# Patient Record
Sex: Male | Born: 1955
Health system: Southern US, Community
[De-identification: ages and names within clinical notes are randomized; demographics above are authoritative.]

## PROBLEM LIST (undated history)

## (undated) DIAGNOSIS — R112 Nausea with vomiting, unspecified: Secondary | ICD-10-CM

## (undated) DIAGNOSIS — R0602 Shortness of breath: Secondary | ICD-10-CM

## (undated) DIAGNOSIS — R079 Chest pain, unspecified: Secondary | ICD-10-CM

## (undated) DIAGNOSIS — I10 Essential (primary) hypertension: Secondary | ICD-10-CM

## (undated) HISTORY — DX: Essential (primary) hypertension: I10

## (undated) HISTORY — PX: NO PAST SURGERIES: SHX2092

## (undated) HISTORY — DX: Chest pain, unspecified: R07.9

## (undated) HISTORY — DX: Shortness of breath: R06.02

## (undated) HISTORY — DX: Nausea with vomiting, unspecified: R11.2

---

## 2004-06-10 ENCOUNTER — Ambulatory Visit: Payer: Self-pay

## 2006-05-29 ENCOUNTER — Emergency Department: Payer: Self-pay | Admitting: Emergency Medicine

## 2009-01-31 DIAGNOSIS — B351 Tinea unguium: Secondary | ICD-10-CM | POA: Insufficient documentation

## 2009-01-31 DIAGNOSIS — L03039 Cellulitis of unspecified toe: Secondary | ICD-10-CM | POA: Insufficient documentation

## 2009-10-17 DIAGNOSIS — R197 Diarrhea, unspecified: Secondary | ICD-10-CM | POA: Insufficient documentation

## 2011-10-25 ENCOUNTER — Emergency Department: Payer: Self-pay | Admitting: Emergency Medicine

## 2011-10-25 LAB — CBC
HCT: 44.5 % (ref 40.0–52.0)
HGB: 14.8 g/dL (ref 13.0–18.0)
MCHC: 33.2 g/dL (ref 32.0–36.0)
MCV: 87 fL (ref 80–100)
RBC: 5.13 10*6/uL (ref 4.40–5.90)
WBC: 8.6 10*3/uL (ref 3.8–10.6)

## 2011-10-25 LAB — COMPREHENSIVE METABOLIC PANEL
Albumin: 4.1 g/dL (ref 3.4–5.0)
BUN: 16 mg/dL (ref 7–18)
Chloride: 97 mmol/L — ABNORMAL LOW (ref 98–107)
Co2: 26 mmol/L (ref 21–32)
Creatinine: 1.11 mg/dL (ref 0.60–1.30)
EGFR (African American): >60
EGFR (Non-African Amer.): >60
SGOT(AST): 20 U/L (ref 15–37)
SGPT (ALT): 27 U/L
Sodium: 138 mmol/L (ref 136–145)

## 2011-10-25 LAB — CK TOTAL AND CKMB (NOT AT ARMC): CK, Total: 306 U/L — ABNORMAL HIGH (ref 35–232)

## 2011-10-27 ENCOUNTER — Encounter: Payer: Self-pay | Admitting: *Deleted

## 2011-10-28 ENCOUNTER — Ambulatory Visit (INDEPENDENT_AMBULATORY_CARE_PROVIDER_SITE_OTHER): Payer: Self-pay | Admitting: Cardiovascular Disease

## 2011-10-28 ENCOUNTER — Encounter: Payer: Self-pay | Admitting: Cardiovascular Disease

## 2011-10-28 DIAGNOSIS — R079 Chest pain, unspecified: Secondary | ICD-10-CM | POA: Insufficient documentation

## 2011-10-28 DIAGNOSIS — I10 Essential (primary) hypertension: Secondary | ICD-10-CM | POA: Insufficient documentation

## 2011-10-28 DIAGNOSIS — E119 Type 2 diabetes mellitus without complications: Secondary | ICD-10-CM | POA: Insufficient documentation

## 2011-10-28 MED ORDER — LISINOPRIL 20 MG PO TABS: 20.0000 mg | ORAL_TABLET | Freq: Every day | ORAL | Status: DC

## 2011-10-28 MED ORDER — METOPROLOL TARTRATE 25 MG PO TABS: 25.0000 mg | ORAL_TABLET | Freq: Two times a day (BID) | ORAL | Status: AC

## 2011-10-28 NOTE — Assessment & Plan Note (Signed)
We have encouraged him to [facility]up [facility].

## 2011-10-28 NOTE — Progress Notes (Signed)
Patient ID: [name], male    DOB: [birth year], 56 y.o.   MRN: [id number]  HPI Comments: [name] is a pleasant 56 year old gentleman previously seen at [facility] last visit approximately one year ago per the patient seen by Joneen Boers, with diabetes since 2006, no smoking history who presents after being evaluated in the emergency room at [facility] and referred to our office for further evaluation for chest pain.  He reports that this past Monday, 4 days ago, he had an episode of tachycardia lasting for 15 minutes while he was sleeping. Symptoms resolved but then he developed some chest tightness lasting for 10 minutes. He went to the emergency room. EKG showed nonspecific T-wave abnormality in leads 3, aVF poor R wave progression through the anterior precordial leads. Lab work was essentially normal including normal cardiac enzymes. Glucose level was 446 and he reports he had been not taking his diabetes medicines. He was prescribed his medications and was discharged to home with followup in our office. He denies any further episodes of chest discomfort since that time. Otherwise he feels well.   EKG today shows normal sinus rhythm with rate 94 beats per minute with nonspecific T-wave abnormality in lead V3 to V6, 2, 3, aVF  Outpatient Encounter Prescriptions as of 10/28/2011  Medication Sig Dispense Refill  . glipiZIDE (GLUCOTROL) 5 MG tablet Take 5 mg by mouth daily.      Marland Kitchen lisinopril (PRINIVIL,ZESTRIL) 20 MG tablet Take 1 tablet (20 mg total) by mouth daily.  30 tablet  6  . metFORMIN (GLUCOPHAGE) 1000 MG tablet Take 1,000 mg by mouth 2 (two) times daily with a meal.      . DISCONTD: lisinopril (PRINIVIL,ZESTRIL) 5 MG tablet Take 5 mg by mouth daily.      . metoprolol tartrate (LOPRESSOR) 25 MG tablet Take 1 tablet (25 mg total) by mouth 2 (two) times daily.  60 tablet  6      Review of Systems  Constitutional: Negative.   HENT: Negative.   Eyes: Negative.     Respiratory: Negative.   Cardiovascular: Positive for chest pain and palpitations.  Gastrointestinal: Negative.   Musculoskeletal: Negative.   Skin: Negative.   Neurological: Negative.   Hematological: Negative.   Psychiatric/Behavioral: Negative.   All other systems reviewed and are negative.    BP 150/90  Pulse 96  Ht 5\' 11"  (1.803 m)  Wt 203 lb (92.08 kg)  BMI 28.31 kg/m2  Physical Exam  Nursing note and vitals reviewed. Constitutional: He is oriented to person, place, and time. He appears well-developed and well-nourished.  HENT:  Head: Normocephalic.  Nose: Nose normal.  Mouth/Throat: Oropharynx is clear and moist.  Eyes: Conjunctivae are normal. Pupils are equal, round, and reactive to light.  Neck: Normal range of motion. Neck supple. No JVD present.  Cardiovascular: Normal rate, regular rhythm, S1 normal, S2 normal, normal heart sounds and intact distal pulses.  Exam reveals no gallop and no friction rub.   No murmur heard. Pulmonary/Chest: Effort normal and breath sounds normal. No respiratory distress. He has no wheezes. He has no rales. He exhibits no tenderness.  Abdominal: Soft. Bowel sounds are normal. He exhibits no distension. There is no tenderness.  Musculoskeletal: Normal range of motion. He exhibits no edema and no tenderness.  Lymphadenopathy:    He has no cervical adenopathy.  Neurological: He is alert and oriented to person, place, and time. Coordination normal.  Skin: Skin is warm and dry. No rash noted.  No erythema.  Psychiatric: He has a normal mood and affect. His behavior is normal. Judgment and thought content normal.           Assessment and Plan

## 2011-10-28 NOTE — Assessment & Plan Note (Signed)
Etiology of his chest pain symptoms is uncertain. He does have nonspecific EKG abnormalities. We'll try to obtain an old EKG for our records. He has felt well over the past several days with no recurrent symptoms. We have suggested if he has any repeat episodes of chest pain, that he call our office and we will schedule a stress test at [facility].

## 2011-10-28 NOTE — Patient Instructions (Signed)
Please start aspirin 81 mg once a day Start metoprolol one in the Am and PM Increase the lisinopril to 20 mg once a day  Please call us if you have new issues that need to be addressed before your next appt.  Your physician wants you to follow-up in: 3 months.  You will receive a reminder letter in the mail two months in advance. If you don't receive a letter, please call our office to schedule the follow-up appointment.

## 2011-10-28 NOTE — Assessment & Plan Note (Signed)
We will start him on metoprolol her trach 25 mg twice a day and increase the lisinopril to 20 mg daily.

## 2011-11-26 ENCOUNTER — Inpatient Hospital Stay: Payer: Self-pay | Admitting: Psychiatry

## 2011-11-26 LAB — URINALYSIS, COMPLETE
Bilirubin,UR: NEGATIVE
Blood: NEGATIVE
Glucose,UR: >=500 mg/dL (ref 0–75)
Ph: 5 (ref 4.5–8.0)
Protein: NEGATIVE
WBC UR: 1 /HPF (ref 0–5)

## 2011-11-26 LAB — COMPREHENSIVE METABOLIC PANEL
Albumin: 4.3 g/dL (ref 3.4–5.0)
Alkaline Phosphatase: 55 U/L (ref 50–136)
Anion Gap: 12 (ref 7–16)
Bilirubin,Total: 0.3 mg/dL (ref 0.2–1.0)
Chloride: 103 mmol/L (ref 98–107)
EGFR (African American): >60
EGFR (Non-African Amer.): >60
Glucose: 190 mg/dL — ABNORMAL HIGH (ref 65–99)
Sodium: 140 mmol/L (ref 136–145)
Total Protein: 8.1 g/dL (ref 6.4–8.2)

## 2011-11-26 LAB — CBC
MCH: 28.6 pg (ref 26.0–34.0)
MCHC: 33.3 g/dL (ref 32.0–36.0)
MCV: 86 fL (ref 80–100)
Platelet: 265 10*3/uL (ref 150–440)
RBC: 5.5 10*6/uL (ref 4.40–5.90)
RDW: 13.8 % (ref 11.5–14.5)
WBC: 6.3 10*3/uL (ref 3.8–10.6)

## 2011-11-26 LAB — DRUG SCREEN, URINE
Amphetamines, Ur Screen: NEGATIVE (ref ?–1000)
Barbiturates, Ur Screen: NEGATIVE (ref ?–200)
Benzodiazepine, Ur Scrn: NEGATIVE (ref ?–200)
Cannabinoid 50 Ng, Ur ~~LOC~~: NEGATIVE (ref ?–50)
Methadone, Ur Screen: NEGATIVE (ref ?–300)
Opiate, Ur Screen: NEGATIVE (ref ?–300)
Phencyclidine (PCP) Ur S: NEGATIVE (ref ?–25)

## 2011-11-26 LAB — ETHANOL: Ethanol %: <0.003 % (ref 0.000–0.080)

## 2011-11-26 LAB — ACETAMINOPHEN LEVEL: Acetaminophen: <2 ug/mL

## 2011-11-28 ENCOUNTER — Ambulatory Visit: Payer: Self-pay | Admitting: Neurology

## 2011-11-30 LAB — HEMOGLOBIN A1C

## 2012-01-28 ENCOUNTER — Ambulatory Visit: Payer: Self-pay | Admitting: Cardiovascular Disease

## 2012-03-04 ENCOUNTER — Emergency Department: Payer: Self-pay | Admitting: Emergency Medicine

## 2012-03-04 LAB — CBC WITH DIFFERENTIAL/PLATELET
Basophil #: 0 10*3/uL (ref 0.0–0.1)
Basophil %: 0.6 %
Eosinophil %: 1 %
HCT: 44.3 % (ref 40.0–52.0)
HGB: 15.1 g/dL (ref 13.0–18.0)
Lymphocyte #: 3.7 10*3/uL — ABNORMAL HIGH (ref 1.0–3.6)
MCH: 28.9 pg (ref 26.0–34.0)
MCV: 85 fL (ref 80–100)
Monocyte %: 7.3 %
Neutrophil %: 39.8 %
Platelet: 240 10*3/uL (ref 150–440)
RBC: 5.21 10*6/uL (ref 4.40–5.90)
RDW: 13.7 % (ref 11.5–14.5)
WBC: 7.2 10*3/uL (ref 3.8–10.6)

## 2012-03-04 LAB — URINALYSIS, COMPLETE
Bacteria: NONE SEEN
Bilirubin,UR: NEGATIVE
Glucose,UR: >=500 mg/dL (ref 0–75)
Ph: 5 (ref 4.5–8.0)
RBC,UR: <1 /HPF (ref 0–5)
Squamous Epithelial: <1
WBC UR: <1 /HPF (ref 0–5)

## 2012-03-04 LAB — BASIC METABOLIC PANEL
BUN: 22 mg/dL — ABNORMAL HIGH (ref 7–18)
Calcium, Total: 8.8 mg/dL (ref 8.5–10.1)
EGFR (Non-African Amer.): 51 — ABNORMAL LOW
Glucose: 496 mg/dL — ABNORMAL HIGH (ref 65–99)
Potassium: 4.3 mmol/L (ref 3.5–5.1)
Sodium: 135 mmol/L — ABNORMAL LOW (ref 136–145)

## 2012-11-09 ENCOUNTER — Other Ambulatory Visit: Payer: Self-pay | Admitting: Cardiovascular Disease

## 2012-12-07 ENCOUNTER — Emergency Department: Payer: Self-pay | Admitting: Emergency Medicine

## 2012-12-07 LAB — BASIC METABOLIC PANEL
Anion Gap: 5 — ABNORMAL LOW (ref 7–16)
BUN: 12 mg/dL (ref 7–18)
Calcium, Total: 8.3 mg/dL — ABNORMAL LOW (ref 8.5–10.1)
Co2: 30 mmol/L (ref 21–32)
Osmolality: 277 (ref 275–301)
Potassium: 3.6 mmol/L (ref 3.5–5.1)
Sodium: 136 mmol/L (ref 136–145)

## 2012-12-07 LAB — CBC
HGB: 14.1 g/dL (ref 13.0–18.0)
MCV: 84 fL (ref 80–100)
RBC: 5 10*6/uL (ref 4.40–5.90)
WBC: 7.7 10*3/uL (ref 3.8–10.6)

## 2012-12-07 LAB — CK TOTAL AND CKMB (NOT AT ARMC): CK, Total: 683 U/L — ABNORMAL HIGH (ref 35–232)

## 2013-02-05 ENCOUNTER — Telehealth: Payer: Self-pay

## 2013-02-05 NOTE — Telephone Encounter (Signed)
Request from Retta Mac  , sent to [employer] on 02/06/2013 .

## 2013-05-04 ENCOUNTER — Telehealth: Payer: Self-pay

## 2013-05-04 NOTE — Telephone Encounter (Signed)
Request from Retta Mac , sent to [employer] on 05/04/2013 .

## 2013-09-06 ENCOUNTER — Ambulatory Visit: Payer: Self-pay | Admitting: Family Medicine

## 2013-09-21 LAB — HEPATIC FUNCTION PANEL
ALT: 21 U/L (ref 10–40)
AST: 16 U/L (ref 14–40)
Alkaline Phosphatase: 60 U/L (ref 25–125)
BILIRUBIN, TOTAL: 0.3 mg/dL

## 2013-10-04 LAB — HEMOGLOBIN A1C: HEMOGLOBIN A1C: 12.2 % — AB (ref 4.0–6.0)

## 2014-12-15 NOTE — Discharge Summary (Signed)
PATIENT NAME:  [name], [name] MR#:  [id number] DATE OF BIRTH:  01/16/1956  DATE OF ADMISSION:  11/26/2011 DATE OF DISCHARGE:  11/29/2011  HOSPITAL COURSE: See dictated history and physical for details. This 59 year old man presented to the Emergency Room complaining of severe headaches which were occurring daily. He was referred to Psychiatry because he also said they were causing anger and that he was afraid he was going to hurt someone in his family when he was angry. The patient did not show any symptoms of psychosis. He did not show any actual desire to hurt anyone. He did not have any actual homicidal ideation and did not have any suicidal ideation. He was primarily concerned about his recurrent headache. It appears he probably hasn't gotten them taken care of in part because of lack of insurance coverage. In the hospital he did not display any dangerous or aggressive behavior at all. His affect remained pleasant and euthymic. He got along well with others. He was treated for his headaches with Motrin 800 mg and initially treated for sleep with 2 mg of Ativan at night. The Motrin helped his headache quite a bit. He felt much better the following day. A Neurology consult was obtained and made some suggestions about headache prophylaxis. The patient was started on propranolol 60 mg extended-release a day, amitriptyline 25 mg at night, and magnesium 400 mg twice a day. Continued on the Motrin and was also counseled about the dangers of rebound headache and encouraged to use the Motrin sparingly and try not to use it every day if possible. He was counseled about possible etiologies including increased blood sugar. It appears that his blood sugars probably run high in the evening time when he gets his headache. He also probably has increased stress at that time of day. Initially I had thought the patient might be depressed but once he got some rest and his headache was improved he was not reporting ongoing  depressive symptoms that were clearcut. I did not start him on any antidepressant unless you count the low dose of amitriptyline. At this point the patient is denying any suicidal or homicidal ideation. He has been referred back to his primary care doctor. He did request follow-up therapy and will be referred to [facility] for intake evaluation consideration of therapy for stress management. I spoke to his wife while he was here. She was not afraid of him coming home and gave a history very similar to his.   LABORATORY DATA: Admission labs showed drug screen negative. TSH normal. Alcohol undetectable. Chemistry panel showed an elevated glucose at 190. Hematology panel was normal. Urinalysis unremarkable. Follow-up blood sugars have shown some elevation in the 200's with a high reading usually in the evening.    DISCHARGE MEDICATIONS:  1. Ibuprofen 800 mg per day as needed for headache.  2. Glyburide 5 mg per day.  3. Lisinopril 20 mg per day.  4. Inderal LA 60 mg per day.  5. Amitriptyline 25 mg at night.  6. Magnesium oxide 400 mg twice a day.   MENTAL STATUS EXAMINATION: Neatly dressed and groomed man. Cooperative and pleasant. Good eye contact. Normal psychomotor activity. Speech normal rate, tone, and volume. Affect reactive, euthymic and appropriate. Mood stated as good. Thoughts lucid and directed. No evidence of loosening of associations. Denies any suicidal or homicidal ideation. Denies delusions or hallucinations. Shows improved judgment and insight.   DISPOSITION: As noted above.   DIAGNOSES PRINCIPLE AND PRIMARY:  AXIS I: Mood disorder,  not otherwise specified.   SECONDARY DIAGNOSES:  AXIS I: No further diagnosis.   AXIS II: No diagnosis.   AXIS III:  1. Headaches, chronic, unclear etiology.  2. Diabetes, probably not very well controlled. 3. High blood pressure.   AXIS IV: Severe. Stress from being out of work and having teenage children.   AXIS V: Functioning at time of  discharge 58.   ____________________________ Gonzella Lex, MD jtc:drc D: 11/29/2011 11:46:57 ET T: 11/30/2011 10:00:54 ET JOB#: [id number]  cc: Gonzella Lex, MD, <Dictator> Gonzella Lex MD ELECTRONICALLY SIGNED 11/30/2011 23:02

## 2014-12-15 NOTE — Consult Note (Signed)
Referring Physician:  Gonzella Lex   Primary Care Physician:  Nonlocal MD, MD :   Reason for Consult:  Admit Date: 26-Nov-2011   Chief Complaint: headache accompanied by thoughts of hurting others   Reason for Consult: headache   History of Present Illness:  History of Present Illness:   [name] is a 59 eyar-old male with a past medical history significant for hypertension and diabetes mellitus who was hopsitalized on 11/26/2011 for headche accompanied by thoughts of hurting othes. The patient reports that for the past 6-7 months, he has had a headache that consistently comes on at 8 PM every night. He denies any headaches during the day. The problem has been getting worse over the past 3 weeks. He describes that the headaches typically occur all around his head and feel like a "pressure cooker" is in his head. The headaches are associated with photophobia/phonophobia and sometimes associated with nausea/vomitting. At times, he sometimes experiences numbness in his left arm and left leg during the headaches. He also reports that his eyesight becomes worse during the headaches, as he has eye pain and difficulty seeing; howeverm, he also notes that his vision seems to be worsening during the day as well. He tried taking over-the-coutner ibuprofen, but this did not help. Being in a quiet, dark room does help relieve the headache. The patient reports that the headaches cause him to feel angry and makes him feel the urge to hurt others. Thisis why he presentedto the hospital for help. He reports that his headaches and mood have been under better control over thepast 2 days since he was hospitalized.   ROS:   General denies complaints    HEENT worsening vision    Lungs no complaints    Cardiac chest pain    GI no complaints    GU urinary frequency    Musculoskeletal no complaints    Extremities no complaints    Skin no complaints    Neuro headache    Endocrine no complaints     Psych sleep disturbance   Past Medical/Surgical Hx:  Diabetes:   Hypertension:   Home Medications: Medication Instructions Last Modified Date/Time  glyBURIDE 5 mg oral tablet 1  orally once a day x 30 days 06-Apr-13 11:13  Fortamet 1000 mg oral tablet, extended release 1  orally 2 times a day x 30 days 06-Apr-13 11:13  lisinopril 20 mg oral tablet 1 tab(s) orally once a day 06-Apr-13 11:13   [facility] Current Meds:  Acetaminophen * tablet, ( Tylenol (325 mg) tablet)  650 mg Oral q4h PRN for Pain/Headache  - Indication: Pain/Fever  Alum-Mag hydrox w/simethicone 400-400-47m/5mL suspension, ( Antacid Double Strength suspension )  30 ml Oral q4h PRN for indigestion, heartburn  -Indication:Antacid/ Indigestion/ Heartburn  LORazepam tablet,  ( Ativan)  2 mg Oral at bedtime PRN for sleep  - Indication: Anxiety/ Seizure/ Antiemetic Adjunct/ Preop Sedation  Magnesium Hydroxide Suspension, ( Milk of Magnesia )  30 ml Oral at bedtime PRN for constipation  -Indication:Constipation/ Laxative  Nicotine Oral Inhaler Kit, ( Nicotrol Oral inhaler kit )  1 cartridge Inhalation q1h PRN for nicotine craving  - Indication: smoking cessation  Instructions:  Use usually limited to 6-16 cartridges in 24 hours.  Puff on cartridge for about 20 minutes.  [Dustin FolksCode: Black with pkg]  Ibuprofen tablet, 800 mg Oral q8h PRN for headache  - Indication: Analgesic/ Antipyretic/ Antiinflammatory  glyBURIDE tablet, ( DiaBeta)  5 mg Oral daily with  meal  - Indication: Diabetes  Instructions:  Start today  Lisinopril tablet, ( Zestril)  20 mg Oral daily  - Indication: Hypertension/ CHF  Instructions:  start today  metFORMIN tablet, ( Glucophage)  1000 mg Oral bid/wm  - Indication: Diabetes  Instructions:  Give with meals  Start today  Propranolol LA capsule, ( Inderal LA)  60 mg Oral daily  - Indication: Hypertension/ Angina/ Tremor/ Arrhythmias/ Akathesia  Instructions:  DO NOT CRUSHSTART  TODAY  Allergies:  No Known Allergies:   Social/Family History:  Social History: Lives with his wife, son, and 2 daughters. Currently unemployed, which he endorsesis a source of stress for him. Hedoes noteheadaches tend to be worse with stress. Denies tobacco, alcohol, or illicit drug use.   Family History: Negative for headache or migraines.   Vital Signs: **Vital Signs.:   07-Apr-13 08:08   Vital Signs Type Routine   Temperature Temperature (F) 98.4   Celsius 36.8   Pulse Pulse 74   Respirations Respirations 18   Systolic BP Systolic BP 841   Diastolic BP (mmHg) Diastolic BP (mmHg) 66   Systolic BP Systolic BP 324   Diastolic BP (mmHg) Diastolic BP (mmHg) 76   Physical Exam:  General: No acute distress   HEENT: Microcephalic   Neck: No bruits   Chest: CTAB   Cardiac: RRR   Extremities: No edema   Neurologic Exam:  Mental Status: Awake and alert, oriented to self, hospital, April 2013. Attention and concentration intact. Speech without dysarthria, naming and repetition intact.   Cranial Nerves: Visual fields intact. No papilledema noted. PERRL. EOMI. Facial sensation intact. Face symmetric. Slightly diminishedhearing in right ear. Palate elevates midline. Full shoulder shrug bilaterally. Tongue protrudes midline.   Motor Exam: Normal bulk and tone. No pronator drift. 5/5 strength bilateral upper and lower extremities.   Deep Tendon Reflexes: Trace throughout. Toes downgoing bilaterally.   Sensory Exam: Intact to light touch throughout   Coordination: Finger-to-nose intact bilaterally. Rapid alternating movements inact.   Cerebellar Exam: No ataxia.   Gait: Normal gait and station.   Lab Results: Thyroid:  05-Apr-13 10:22    Thyroid Stimulating Hormone 1.35  Hepatic:  05-Apr-13 10:22    Bilirubin, Total 0.3   Alkaline Phosphatase 55   SGPT (ALT) 30   SGOT (AST) 20   Total Protein, Serum 8.1   Albumin, Serum 4.3  General Ref:  05-Apr-13 10:22    Acetaminophen,  Serum < 2   Salicylates, Serum < 1.7  Routine Chem:  05-Apr-13 10:22    Glucose, Serum   190   BUN   19   Creatinine (comp) 1.02   Sodium, Serum 140   Potassium, Serum 4.0   Chloride, Serum 103   CO2, Serum 25   Calcium (Total), Serum 9.4   Osmolality (calc) 287   eGFR (African American) >60   eGFR (Non-African American) >60   Anion Gap 12   Ethanol, S. < 3   Ethanol % (comp) < 0.003  Urine Drugs:  [id number]-[id number] 53:66    Tricyclic Antidepressant, Ur Qual (comp) NEGATIVE   Amphetamines, Urine Qual. NEGATIVE   MDMA, Urine Qual. NEGATIVE   Cocaine Metabolite, Urine Qual. NEGATIVE   Opiate, Urine qual NEGATIVE   Phencyclidine, Urine Qual. NEGATIVE   Cannabinoid, Urine Qual. NEGATIVE   Barbiturates, Urine Qual. NEGATIVE   Benzodiazepine, Urine Qual. NEGATIVE   Methadone, Urine Qual. NEGATIVE  Routine UA:  05-Apr-13 10:23    Color (UA) Yellow   Clarity (UA) Clear  Glucose (UA) >=500   Bilirubin (UA) Negative   Ketones (UA) Trace   Specific Gravity (UA) 1.025   Blood (UA) Negative   pH (UA) 5.0   Protein (UA) Negative   Nitrite (UA) Negative   Leukocyte Esterase (UA) Negative   RBC (UA) 1 /HPF   WBC (UA) 1 /HPF   Mucous (UA) PRESENT  Routine Hem:  05-Apr-13 10:22    WBC (CBC) 6.3   RBC (CBC) 5.50   Hemoglobin (CBC) 15.7   Hematocrit (CBC) 47.1   Platelet Count (CBC) 265   MCV 86   MCH 28.6   MCHC 33.3   RDW 13.8   Radiology Results: CT:    05-Apr-13 10:14, CT Head Without Contrast   CT Head Without Contrast    REASON FOR EXAM:    headaches  COMMENTS:       PROCEDURE: CT  - CT HEAD WITHOUT CONTRAST  - Nov 26 2011 10:14AM     RESULT: Noncontrast emergent CT of the brain is performed in the standard   fashion. The patient has no previous exam for comparison.    Symmetric basal ganglia calcification is present. The ventricles and   sulci are normal. There is no hemorrhage. There is no focal mass,   mass-effect or midline shift. There is no evidence of edema  or   territorial infarct. The bone windows demonstrate normal aeration of the   paranasal sinuses and mastoid air cells. There is no skull fracture   demonstrated.    IMPRESSION:    1. No acute intracranial abnormality.          Verified By: Sundra Aland, M.D., MD   Impression/Recommendations:  Recommendations:   59 year-old male with past medical history signifiant for hypertension and diabetes mellitus who was hosptialized for heaache accompanied by thoughts of hurting others. Parts of patient's headache seem consistent with migiraine (nausea/vomitting, photophobia/phonophobia), while other elements seem to suggest tension headache ("tugging" sensation around his head sometimes). His headaches are worse with stress, which can be seen in either migraines or tension headache. The patient reports that the medications he has received in the hospital have helped ease the severity of his headache.brain was unremarkable. Given non-focal neurological exam, do not see indication for any further imaging.would likely benefit from being on a medication for daily headache prophylaxis. Currently, he has been on propranolol, which appears to be helping somewhat. Can continue propranolol as outpatient. Other medications to consider include magnesium oxide 400 mg twice daily and/or riboflavin 200 mg twice daily. Amitriptyline, Topamax, or depakote could also be considered, though in setting of patient's psychiatric complaints, this would need to be carefully coordinated with his psychiatrist.800 mg appears to be working well as an abortive agent for patient's headaches, as long as it is given in the beginning stages of the headache, before the headache has had a chance to reach maximum severity. It would be reasonable to continue using ibuprofen 800 mg PRN as an abortive agent (triptans can be very expensive, and patient already endorses lotsof financial stressors related to his unemployment). Patient should  be counseled that he should try to avoid using analgesics daily, as this can eventually lead to the phenomenon of chronic daily headache/chronic rebound headache.does seem unusual that the patient's headaches start exactly at 8 PM every night. One remote possibility is that his anti-hypertensive medications (which he reports taking in the morning) have worn off by that time, and high blood pressure may be contributing to  his headaches. Would be helpful to check a blood pressure on the patient whilehe is in the hospital when he is starting to experience a headache to see if this is the case; if so, he may benefit from having his anti-hypertensive regimen adjusted.does seem to be playing a role in the patient's headaches. Will defer to primary team regarding management of patient's stress, but I suspect that his headache control might improve with good stress management.  Electronic Signatures: Rennis Chris (MD)  (Signed 07-Apr-13 10:23)  Authored: REFERRING PHYSICIAN, Primary Care Physician, Consult, History of Present Illness, Review of Systems, PAST MEDICAL/SURGICAL HISTORY, HOME MEDICATIONS, Current Medications, ALLERGIES, Social/Family History, NURSING VITAL SIGNS, Physical Exam-, LAB RESULTS, RADIOLOGY RESULTS, Recommendations   Last Updated: 07-Apr-13 10:23 by Rennis Chris (MD)

## 2014-12-15 NOTE — H&P (Signed)
PATIENT NAME:  [name], [name] DATE OF BIRTH:  [birth year]  DATE OF ADMISSION:  11/26/2011  IDENTIFYING INFORMATION AND CHIEF COMPLAINT: This is a 59 year old man who came to the Emergency Room voluntarily with complaints of daily headaches accompanied by anger.   CHIEF COMPLAINT: "I feel like my head is in a pressure cooker."   HISTORY OF PRESENT ILLNESS: Information was obtained primarily from interview with the patient and review of other chart information and lab results. The patient states that for the past several months he has been suffering from daily headaches. The headaches start at about 8:00 p.m. every evening and he indicates they are all over the crown of his head.  They will sometimes be accompanied by a pain that runs down his left arm and left side as well. He will feel like his teeth are hurting at the same time. He is feeling extreme anger when the headache comes on. He starts to get intrusive thoughts of anger and of wanting to hurt his three children or hurt his wife or hurt himself. He has not actually acted on any of this physically but he has been raising his voice and showing more temper. He had not gone to a doctor until today for it and has only tried taking Motrin on one occasion and only at a low dose. Additionally, he reports that during the day his mood is feeling more down and anxious and that he is getting more and irritable during the day. He has been losing his temper and being short with people that he would not normally be. He has been feeling tired and run down. He does not feel very motivated during the day. He denies that he is having any thoughts about wanting to die or kill himself during the day, although he has had passive thoughts of that sort when the pain is bad. He describes one episode that happened within the last week or two in which he had a sudden onset of extreme fear. It lasted for about half an hour. It sounds like a panic attack, but  there have not been any others that he describes. The patient has diabetes and says he has been compliant and consistent with his diabetes and blood pressure medicine. He does not have any other known medical problems. He does not drink and does not use any drugs and does not smoke. He does report being under a great deal of emotional stress, although I had to specifically point it out to him for him to admit it. He has been out of work for about a year, unable to find a job, has applied for disability but of course it has not come through yet. He feels guilty about not providing financially for the family.   PAST PSYCHIATRIC HISTORY: Evidently no past psychiatric history at all. Never seen a psychiatrist, therapist, or counselor in the past. Never been on any psychiatric medicine. Never been in a psychiatric hospital. He denies that he has ever been through a period of problems with nerves or mood in the past.   PAST MEDICAL HISTORY: He has diabetes. Says that he was diagnosed a few years ago. Has been managed with oral medication. Also has high blood pressure and takes lisinopril. He has never had a heart attack or stroke. Never had any other complications that he knows of from his illness. He mentions to me in passing that his eyesight seems to have been getting worse recently. When the  headaches are bad he will definitely have a fuzziness to his eyesight but he thinks that his eyesight is getting worse even in the daytime.   CURRENT MEDICATIONS:  1. Glyburide 5 mg per day.  2. Fortamet 1000 mg twice a day. 3. Lisinopril 20 mg per day.   ALLERGIES: He has no known drug allergies.   SUBSTANCE ABUSE HISTORY: As mentioned above. No history.   FAMILY HISTORY: No family history of mental illness known.   SOCIAL HISTORY: The patient is married and has three children, a 73 year old son, and 40 year old twin daughters. He says he loves all of them very much and they do not give him any trouble. He says  he does not have any reason to be angry with him but he finds himself losing his temper. He is afraid that he might lose it and do something to hurt them. As I mentioned, he has been out of work for about a year. It is taking a real strain on him. He also says that he does not have many close friends or other people that he can talk to regularly about his problems. He stays pretty isolated.   REVIEW OF SYSTEMS: Right at the moment he is mentioning that he still feels like his eyesight is fuzzy. He does not currently have a headache but says that every evening the headache will be severe around the crown of his head and will be made worse if he tries to lie down. He denies any gastrointestinal symptoms. Denies any numbness in his face. He does get pain in his arms and upper jaw when the headache happens. He endorses feeling anxious and stressed during the day. He denies any acute suicidal or homicidal intent or plan. Denies hallucinations.   MENTAL STATUS EXAM: The patient was cooperative completely with the interview and appeared to be quite open. Eye contact was good. Psychomotor activity normal. Speech normal rate and tone. Affect was euthymic, a little bit anxious, appropriately so for the situation. Mood stated as being worried. Thoughts are lucid. No indication of loosening of associations or delusional thinking or bizarre ideation. He denies having any suicidal or homicidal intent or plan. Currently no ideation. No hallucinations. Judgment and insight at the moment are good but he is worried that his judgment might be poor when he is having one of these headaches.   PHYSICAL EXAMINATION:  GENERAL: The patient is a normally built man who does not appear in any acute distress. He does not look like he is having any acute pain.   HEENT: Pupils are equal and reactive.   HEENT: Eyes are both a little bloodshot but are symmetrical. The rest of the face is symmetric. Mucosa are moist.   NECK: Supple.    BACK: Nontender.   MUSCULOSKELETAL:  He has normal strength and symmetric strength throughout. Reflexes normal. Gait normal.  Full range of motion at extremities.   HEART: Regular rate and rhythm. No extra sounds.   LUNGS: Clear to auscultation.   ABDOMEN: Soft, nontender, normal bowel sounds.   VITAL SIGNS: Temperature 97.9, pulse 97, respirations 20, blood pressure 168/79.   LABORATORY, DIAGNOSTIC, AND RADIOLOGICAL DATA: They did do a CT scan without contrast today in the Emergency Room. It is entirely normal. He had regular chemistry panel work-up. He has a negative drug screen. TSH normal. No alcohol. Chemistry panel most notable for glucose elevated at 190. The CBC is normal. Urinalysis clear.   ASSESSMENT: 59 year old man who presents with a subacute  worsening of primarily physical symptoms of daily headaches, but they are accompanied by worrisome psychiatric symptoms of anger and rage with thoughts about hurting someone. In addition he is reporting having worsening anxiety, irritability, and fatigue all during the daytime. He is not showing acute signs of psychosis. He has no past psychiatric history and has no substance abuse history. It is not clear exactly what is going on. It is not clear to me whether this is primarily a headache problem and the mood is secondary, or the other way around, or they are two separate problems. My suspicion is that however we think of the headaches that he may have a depression underlying this which may be driving this. Typically I do not think of these kinds of headaches as being accompanied by this kind of anger and rage. Right now we do not have a physiologic cause for the headaches. I do think he needs to be admitted for evaluation, particularly with the threatening statements.   TREATMENT PLAN:  I am going to admit him to psychiatry. He is agreeable to that. For the moment I am just going to put him on some Ativan at night to sleep and to give him  some calm from his anxiety, and some p.r.n. Motrin for the headache.  I will be interested tomorrow morning to see if either or both of these are effective and how much they helped. I am going to ask for a neurology consult to help with advice about the headaches.  I considered starting him on a trial of triptans but I think I may wait until neurology sees him. Until I get to know him a little better I am going to hold off on antidepressants. He will be engaged in groups and activities and observation on the unit. Social work will do the evaluation. We will speak with the family and try and get a better feel for him diagnostically.   DIAGNOSIS PRINCIPLE AND PRIMARY:  AXIS I: Depression, not otherwise specified.   SECONDARY DIAGNOSES:  AXIS I: No other diagnosis.   AXIS II: Deferred.   AXIS III: Severe daily headaches of unclear etiology, diabetes, high blood pressure.   AXIS IV: Moderate to severe from being out of work and the social stress that accompanies that.   AXIS V: Functioning at time of evaluation: 53.    ____________________________ Gonzella Lex, MD jtc:bjt D:  11/26/2011 16:13:39 ET          T: 11/26/2011 17:14:43 ET         JOB#: [id number]  cc: Gonzella Lex, MD, <Dictator> Gonzella Lex MD ELECTRONICALLY SIGNED 11/27/2011 10:08

## 2015-01-02 ENCOUNTER — Encounter: Payer: Self-pay | Admitting: Emergency Medicine

## 2015-01-02 ENCOUNTER — Emergency Department: Payer: Medicare Other

## 2015-01-02 ENCOUNTER — Emergency Department
Admission: EM | Admit: 2015-01-02 | Discharge: 2015-01-02 | Disposition: A | Discharge status: Home / Self Care | Payer: Medicare Other | Attending: Student | Admitting: Student

## 2015-01-02 DIAGNOSIS — Y9389 Activity, other specified: Secondary | ICD-10-CM | POA: Diagnosis not present

## 2015-01-02 DIAGNOSIS — S3991XA Unspecified injury of abdomen, initial encounter: Secondary | ICD-10-CM | POA: Insufficient documentation

## 2015-01-02 DIAGNOSIS — M79651 Pain in right thigh: Secondary | ICD-10-CM

## 2015-01-02 DIAGNOSIS — Y998 Other external cause status: Secondary | ICD-10-CM | POA: Diagnosis not present

## 2015-01-02 DIAGNOSIS — E119 Type 2 diabetes mellitus without complications: Secondary | ICD-10-CM | POA: Insufficient documentation

## 2015-01-02 DIAGNOSIS — S8991XA Unspecified injury of right lower leg, initial encounter: Secondary | ICD-10-CM | POA: Diagnosis present

## 2015-01-02 DIAGNOSIS — I10 Essential (primary) hypertension: Secondary | ICD-10-CM | POA: Diagnosis not present

## 2015-01-02 DIAGNOSIS — T148XXA Other injury of unspecified body region, initial encounter: Secondary | ICD-10-CM

## 2015-01-02 DIAGNOSIS — Z79899 Other long term (current) drug therapy: Secondary | ICD-10-CM | POA: Insufficient documentation

## 2015-01-02 DIAGNOSIS — M79604 Pain in right leg: Secondary | ICD-10-CM

## 2015-01-02 DIAGNOSIS — Y92410 Unspecified street and highway as the place of occurrence of the external cause: Secondary | ICD-10-CM | POA: Insufficient documentation

## 2015-01-02 DIAGNOSIS — S76911A Strain of unspecified muscles, fascia and tendons at thigh level, right thigh, initial encounter: Secondary | ICD-10-CM | POA: Insufficient documentation

## 2015-01-02 MED ORDER — CYCLOBENZAPRINE HCL 5 MG PO TABS: 5.0000 mg | ORAL_TABLET | Freq: Three times a day (TID) | ORAL | Status: DC | PRN

## 2015-01-02 NOTE — Discharge Instructions (Signed)
Take medication as prescribed. Alternate heat and ice for comfort. Stretch daily as discussed. Alternate heat and ice for comfort. Avoid overly strenuous activity. Take over the counter tylenol or ibuprofen as needed.   Follow up with your primary care physician Dr Jeananne Rama or the above in 2-3 days for follow up.  Return to the ER for new or worsening concerns.  Muscle Strain A muscle strain (pulled muscle) happens when a muscle is stretched beyond normal length. It happens when a sudden, violent force stretches your muscle too far. Usually, a few of the fibers in your muscle are torn. Muscle strain is common in athletes. Recovery usually takes 1-2 weeks. Complete healing takes 5-6 weeks.  HOME CARE   Follow the PRICE method of treatment to help your injury get better. Do this the first 2-3 days after the injury:  Protect. Protect the muscle to keep it from getting injured again.  Rest. Limit your activity and rest the injured body part.  Ice. Put ice in a plastic bag. Place a towel between your skin and the bag. Then, apply the ice and leave it on from 15-20 minutes each hour. After the third day, switch to moist heat packs.  Compression. Use a splint or elastic bandage on the injured area for comfort. Do not put it on too tightly.  Elevate. Keep the injured body part above the level of your heart.  Only take medicine as told by your doctor.  Warm up before doing exercise to prevent future muscle strains. GET HELP IF:   You have more pain or puffiness (swelling) in the injured area.  You feel numbness, tingling, or notice a loss of strength in the injured area. MAKE SURE YOU:   Understand these instructions.  Will watch your condition.  Will get help right away if you are not doing well or get worse. Document Released: 05/18/2008 Document Revised: 05/30/2013 Document Reviewed: 03/08/2013 [facility] Patient Information 2015 [place], [place]. This information is not intended to  replace advice given to you by your health care provider. Make sure you discuss any questions you have with your health care provider.  Musculoskeletal Pain Musculoskeletal pain is muscle and boney aches and pains. These pains can occur in any part of the body. Your caregiver may treat you without knowing the cause of the pain. They may treat you if blood or urine tests, X-rays, and other tests were normal.  CAUSES There is often not a definite cause or reason for these pains. These pains may be caused by a type of germ (virus). The discomfort may also come from overuse. Overuse includes working out too hard when your body is not fit. Boney aches also come from weather changes. Bone is sensitive to atmospheric pressure changes. HOME CARE INSTRUCTIONS   Ask when your test results will be ready. Make sure you get your test results.  Only take over-the-counter or prescription medicines for pain, discomfort, or fever as directed by your caregiver. If you were given medications for your condition, do not drive, operate machinery or power tools, or sign legal documents for 24 hours. Do not drink alcohol. Do not take sleeping pills or other medications that may interfere with treatment.  Continue all activities unless the activities cause more pain. When the pain lessens, slowly resume normal activities. Gradually increase the intensity and duration of the activities or exercise.  During periods of severe pain, bed rest may be helpful. Lay or sit in any position that is comfortable.  Putting  ice on the injured area.  Put ice in a bag.  Place a towel between your skin and the bag.  Leave the ice on for 15 to 20 minutes, 3 to 4 times a day.  Follow up with your caregiver for continued problems and no reason can be found for the pain. If the pain becomes worse or does not go away, it may be necessary to repeat tests or do additional testing. Your caregiver may need to look further for a possible  cause. SEEK IMMEDIATE MEDICAL CARE IF:  You have pain that is getting worse and is not relieved by medications.  You develop chest pain that is associated with shortness or breath, sweating, feeling sick to your stomach (nauseous), or throw up (vomit).  Your pain becomes localized to the abdomen.  You develop any new symptoms that seem different or that concern you. MAKE SURE YOU:   Understand these instructions.  Will watch your condition.  Will get help right away if you are not doing well or get worse. Document Released: 08/09/2005 Document Revised: 11/01/2011 Document Reviewed: 04/13/2013 [facility] Patient Information 2015 [place], [place]. This information is not intended to replace advice given to you by your health care provider. Make sure you discuss any questions you have with your health care provider.

## 2015-01-02 NOTE — ED Provider Notes (Signed)
[facility] Emergency Department Provider Note  ____________________________________________  Time seen: Approximately 8:15 AM  I have reviewed the triage vital signs and the nursing notes.   HISTORY  Chief Complaint Leg Pain   HPI [name] is a 59 y.o. male is to the ER with complaints of right upper leg pain. Patient states that onset of pain was approximated 2 weeks ago noticed while driving. States it was a short drive but noticed it when lifting leg to hit brakes. He denies fall or other injury. Reports that he saw his primary care physician 3 days ago and was told that it was likely a muscle strain and to take over-the-counter ibuprofen. He has been taking over-the-counter ibuprofen without relief. Patient denies pain radiation, other complaints.  describes pain at approximately 5 out of 10. Patient states pain is anterior right upper leg. denies pain in back of leg or  hip, abdomen, or lower leg. States pain is aching pain with intermittent sharp stabbing pain. Patient states it feels like pain is a muscle spasm. Patient states minimal pain when resting. States pain is intermittent. Patient states pain is primarily with movement.   Patient denies any recent sickness including vomiting, nausea, diarrhea or fever. Patient denies chest pain, shortness of breath, abdominal pain, dysuria, testicular or penile pain, other leg pain, or other extremity pain.  Reports last seen by his endocrinologist approximately 2 weeks ago had labs drawn and reports labs were normal per patient.   Past Medical History  Diagnosis Date  . Diabetes mellitus   . Chest pain, unspecified     atypical  . Shortness of breath   . Hypertension   . Nausea & vomiting     Patient Active Problem List   Diagnosis Date Noted  . Chest pain 10/28/2011  . Diabetes mellitus 10/28/2011  . HTN (hypertension) 10/28/2011    History reviewed. No pertinent past surgical  history.  Current Outpatient Rx  Name  Route  Sig  Dispense  Refill  . glipiZIDE (GLUCOTROL) 5 MG tablet   Oral   Take 5 mg by mouth daily.         Marland Kitchen lisinopril (PRINIVIL,ZESTRIL) 20 MG tablet   Oral   Take 1 tablet (20 mg total) by mouth daily.   30 tablet   6   . metFORMIN (GLUCOPHAGE) 1000 MG tablet   Oral   Take 1,000 mg by mouth 2 (two) times daily with a meal.           Allergies Review of patient's allergies indicates no known allergies.  No family history on file.  Social History History  Substance Use Topics  . Smoking status: Never Smoker   . Smokeless tobacco: Never Used  . Alcohol Use: No    Review of Systems Constitutional: No fever/chills Eyes: No visual changes. ENT: No sore throat. Cardiovascular: Denies chest pain. Respiratory: Denies shortness of breath. Gastrointestinal: No abdominal pain.  No nausea, no vomiting.  No diarrhea.  No constipation. Genitourinary: Negative for dysuria. Musculoskeletal: As as for right upper leg pain as above. Skin: Negative for rash. Neurological: Negative for headaches, focal weakness or numbness.  10-point ROS otherwise negative.  ____________________________________________   PHYSICAL EXAM:  VITAL SIGNS: ED Triage Vitals  Enc Vitals Group     BP 01/02/15 0727 185/98 mmHg     Pulse Rate 01/02/15 0727 81     Resp 01/02/15 0727 18     Temp 01/02/15 0727 97.9 F (36.6 C)  Temp Source 01/02/15 0727 Oral     SpO2 01/02/15 0727 96 %     Weight 01/02/15 0727 221 lb (100.245 kg)     Height 01/02/15 0727 5\' 11"  (1.803 m)     Head Cir --      Peak Flow --      Pain Score 01/02/15 0728 7     Pain Loc --      Pain Edu? --      Excl. in [place]? --     Constitutional: Alert and oriented. Well appearing and in no acute distress. Eyes: Conjunctivae are normal. PERRL. EOMI. Head: Atraumatic. Nose: No congestion/rhinnorhea. Mouth/Throat: Mucous membranes are moist.  Oropharynx non-erythematous. Neck: No  stridor No cervical spine tenderness to palpation. Hematological/Lymphatic/Immunilogical: No cervical lymphadenopathy. Cardiovascular: Normal rate, regular rhythm. Grossly normal heart sounds.  Good peripheral circulation. Respiratory: Normal respiratory effort.  No retractions. Lungs CTAB. Gastrointestinal: Soft and nontender. No distention. No abdominal bruits. No CVA tenderness. Musculoskeletal: No lower extremity tenderness nor edema.  No joint effusions. Except: Palpable tenderness to right upper anterior thigh soft-tissue. No bony tenderness. No swelling, erythema, induration, fluctuance. Skin is intact. Full range of motion present. No posterior leg pain. No hip, pelvic, groin, knee, or lower extremity pain. Bilateral distal pedal pulses equal and and easily found. No signs of ischemia. Pain increases with right leg lift and right leg abduction. Normal coloration. No ecchymosis. No proximal or distal swelling. Neurologic:  Normal speech and language. No gross focal neurologic deficits are appreciated. Speech is normal. No gait instability. Skin:  Skin is warm, dry and intact. No rash noted. Psychiatric: Mood and affect are normal. Speech and behavior are normal.  ____________________________________________  _______________________________________  RADIOLOGY Right LOWER EXTREMITY VENOUS DOPPLER ULTRASOUND  TECHNIQUE: Gray-scale sonography with graded compression, as well as color Doppler and duplex ultrasound were performed to evaluate the lower extremity deep venous systems from the level of the common femoral vein and including the common femoral, femoral, profunda femoral, popliteal and calf veins including the posterior tibial, peroneal and gastrocnemius veins when visible. The superficial great saphenous vein was also interrogated. Spectral Doppler was utilized to evaluate flow at rest and with distal augmentation maneuvers in the common femoral, femoral and popliteal  veins.  COMPARISON: None.  FINDINGS: Contralateral Common Femoral Vein: Respiratory phasicity is normal and symmetric with the symptomatic side. No evidence of thrombus. Normal compressibility.  Common Femoral Vein: No evidence of thrombus. Normal compressibility, respiratory phasicity and response to augmentation.  Saphenofemoral Junction: No evidence of thrombus. Normal compressibility and flow on color Doppler imaging.  Profunda Femoral Vein: No evidence of thrombus. Normal compressibility and flow on color Doppler imaging.  Femoral Vein: No evidence of thrombus. Normal compressibility, respiratory phasicity and response to augmentation.  Popliteal Vein: No evidence of thrombus. Normal compressibility, respiratory phasicity and response to augmentation.  Calf Veins: No evidence of thrombus. Normal compressibility and flow on color Doppler imaging.  Superficial Great Saphenous Vein: No evidence of thrombus. Normal compressibility and flow on color Doppler imaging.  Venous Reflux: None.  Other Findings: None.  IMPRESSION: No evidence of deep venous thrombosis seen in right lower extremity.   Electronically Signed By: Marijo Conception, M.D. On: 01/02/2015 10:08  RIGHT FEMUR 2 VIEWS  COMPARISON: None.  FINDINGS: There is no evidence of fracture or other focal bone lesions. Soft tissues are unremarkable.  IMPRESSION: Negative.   Electronically Signed By: Franchot Gallo M.D. On: 01/02/2015 08:49  INITIAL IMPRESSION / ASSESSMENT AND PLAN /  ED COURSE  Pertinent labs & imaging results that were available during my care of the patient were reviewed by me and considered in my medical decision making (see chart for details).  Presents to the ER with approximately 2 weeks of intermittent right upper leg pain. Seen by primary care physician 3 days ago with suspicion of muscle strain per patient. Patient presents with soft tissue anterior right upper leg  pain. Pain reproducible on exam. Pain increases with movement, minimal at rest. Suspect muscle strain. Will evaluate x-ray as well as ultrasound.  X-ray and ultrasound negative. Suspect muscular strain. Will treat with by mouth Flexeril when necessary. To take over-the-counter ibuprofen or Tylenol as needed. Alternate heat and ice and stretch. Discussed stretching techniques and demonstrated with patient in room. Follow-up up with primary care physician in 2-3 days. Discussed strict follow-up and return parameters. Patient agreed to plan. Patient denies/refuses need for crutches or [place]. ____________________________________________   FINAL CLINICAL IMPRESSION(S) / ED DIAGNOSES  Final diagnoses:  Muscle strain  Leg pain, anterior, right    Marylene Land, NP 01/02/15 [place], MD 01/02/15 1351

## 2015-01-02 NOTE — ED Notes (Signed)
States past 2 weeks c/o pain right leg, occasionally has right flank pain, some groin pain, sometimes he gives his insulin in right thigh, states when he gave him his shot it hurt, hurts with or without movement, states really hurt llast pm, leg non tender, no reddness, # pedal pules bilaterally

## 2015-01-02 NOTE — ED Notes (Signed)
Pt ambulates triage without difficulty with c/o right leg pain for two weeks. No known injury. Pt bp  High in triage, pt states he has not had his meds this am. No acute distress noted.

## 2015-01-06 LAB — BASIC METABOLIC PANEL
BUN: 18 mg/dL (ref 4–21)
Creatinine: 1.1 mg/dL (ref <=1.3)
Glucose: 189 mg/dL
POTASSIUM: 4.1 mmol/L (ref 3.4–5.3)
Sodium: 141 mmol/L (ref 137–147)

## 2015-01-06 LAB — CBC AND DIFFERENTIAL
HCT: 41 % (ref 41–53)
Hemoglobin: 13.5 g/dL (ref 13.5–17.5)
NEUTROS ABS: 36 /uL
Platelets: 281 10*3/uL (ref 150–399)
WBC: 6.8 10*3/mL

## 2015-01-06 LAB — POCT ERYTHROCYTE SEDIMENTATION RATE, NON-AUTOMATED: Sed Rate: 2 mm

## 2015-01-07 DIAGNOSIS — M545 Low back pain, unspecified: Secondary | ICD-10-CM | POA: Insufficient documentation

## 2015-01-07 DIAGNOSIS — F4489 Other dissociative and conversion disorders: Secondary | ICD-10-CM | POA: Insufficient documentation

## 2015-01-07 DIAGNOSIS — J40 Bronchitis, not specified as acute or chronic: Secondary | ICD-10-CM | POA: Insufficient documentation

## 2015-01-07 DIAGNOSIS — R1013 Epigastric pain: Secondary | ICD-10-CM | POA: Insufficient documentation

## 2015-01-07 DIAGNOSIS — G44209 Tension-type headache, unspecified, not intractable: Secondary | ICD-10-CM | POA: Insufficient documentation

## 2015-01-07 DIAGNOSIS — R053 Chronic cough: Secondary | ICD-10-CM | POA: Insufficient documentation

## 2015-01-07 DIAGNOSIS — R5381 Other malaise: Secondary | ICD-10-CM | POA: Insufficient documentation

## 2015-01-07 DIAGNOSIS — R5383 Other fatigue: Secondary | ICD-10-CM

## 2015-01-07 DIAGNOSIS — F341 Dysthymic disorder: Secondary | ICD-10-CM | POA: Insufficient documentation

## 2015-01-07 DIAGNOSIS — R0681 Apnea, not elsewhere classified: Secondary | ICD-10-CM | POA: Insufficient documentation

## 2015-01-07 DIAGNOSIS — I10 Essential (primary) hypertension: Secondary | ICD-10-CM | POA: Insufficient documentation

## 2015-01-07 DIAGNOSIS — R05 Cough: Secondary | ICD-10-CM | POA: Insufficient documentation

## 2015-01-07 DIAGNOSIS — R109 Unspecified abdominal pain: Secondary | ICD-10-CM | POA: Insufficient documentation

## 2015-01-07 DIAGNOSIS — M79659 Pain in unspecified thigh: Secondary | ICD-10-CM | POA: Insufficient documentation

## 2015-01-07 DIAGNOSIS — R45851 Suicidal ideations: Secondary | ICD-10-CM | POA: Insufficient documentation

## 2015-01-07 DIAGNOSIS — K5900 Constipation, unspecified: Secondary | ICD-10-CM | POA: Insufficient documentation

## 2015-04-08 ENCOUNTER — Encounter: Payer: Self-pay | Admitting: Family Medicine

## 2015-04-08 ENCOUNTER — Ambulatory Visit (INDEPENDENT_AMBULATORY_CARE_PROVIDER_SITE_OTHER): Payer: Medicare Other | Admitting: Family Medicine

## 2015-04-08 VITALS — BP 144/96 | HR 80 | Temp 98.2°F | Resp 16 | Wt 222.6 lb

## 2015-04-08 DIAGNOSIS — R5383 Other fatigue: Secondary | ICD-10-CM

## 2015-04-08 DIAGNOSIS — E1165 Type 2 diabetes mellitus with hyperglycemia: Secondary | ICD-10-CM

## 2015-04-08 DIAGNOSIS — R202 Paresthesia of skin: Secondary | ICD-10-CM | POA: Diagnosis not present

## 2015-04-08 DIAGNOSIS — G44039 Episodic paroxysmal hemicrania, not intractable: Secondary | ICD-10-CM

## 2015-04-08 DIAGNOSIS — I10 Essential (primary) hypertension: Secondary | ICD-10-CM | POA: Diagnosis not present

## 2015-04-08 DIAGNOSIS — IMO0002 Reserved for concepts with insufficient information to code with codable children: Secondary | ICD-10-CM

## 2015-04-08 DIAGNOSIS — R5381 Other malaise: Secondary | ICD-10-CM | POA: Diagnosis not present

## 2015-04-08 MED ORDER — LISINOPRIL 30 MG PO TABS: 20.0000 mg | ORAL_TABLET | Freq: Every day | ORAL | Status: DC

## 2015-04-08 NOTE — Progress Notes (Signed)
Patient ID: [name], male   DOB: May 23, 1956, 59 y.o.   MRN: [id number]   Patient: [name] Male    DOB: 1955/12/07   59 y.o.   MRN: [id number] Visit Date: 04/08/2015  Today's Provider: Vernie Murders, PA   Chief Complaint  Patient presents with  . Numbness    left side X 3 weeks   Subjective:    HPI  This 59 year old male with history of anxiety, depression, hypertension and diabetes, presents with complaint of a sharp pain in the left side of his head that lasts 3-4 minutes. It has been occurring 1-2 times a day for the past 3 weeks. Has a tingling in his left arm and finger tips associated with it. No loss of motor function but has had some fatigue. Worst episode of fatigue was 3 days ago at [employer] and had to use a wheelchair. 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 Blood sugars elevated to 230 and will see his diabetic specialist 04-21-15. Also, having some sinus pressure on the left with post nasal drip occasionally.   Past Medical History  Diagnosis Date  . Diabetes mellitus   . Chest pain, unspecified     atypical  . Shortness of breath   . Hypertension   . Nausea & vomiting    Patient Active Problem List   Diagnosis Date Noted  . Abdominal discomfort 01/07/2015  . Acute low back pain 01/07/2015  . Bronchitis 01/07/2015  . Confusion state 01/07/2015  . CN (constipation) 01/07/2015  . Cough, persistent 01/07/2015  . Dysthymic disorder 01/07/2015  . Breathlessness on exertion 01/07/2015  . Abdominal discomfort, epigastric 01/07/2015  . Essential (primary) hypertension 01/07/2015  . Malaise and fatigue 01/07/2015  . Suicidal ideation 01/07/2015  . Tension type headache 01/07/2015  . Pain of thigh 01/07/2015  . Chest pain 10/28/2011  . Diabetes mellitus 10/28/2011  . HTN (hypertension) 10/28/2011  . D (diarrhea) 10/17/2009  . Onychia of toe 01/31/2009  . Dermatophytic onychia 01/31/2009  . Diabetes mellitus type 2, uncontrolled 06/02/2006    Past Surgical History  Procedure Laterality Date  . No past surgeries     Family History  Problem Relation Age of Onset  . Cancer Father   . Diabetes Sister   . Hypertension Sister   . Heart attack Sister   . Seizures Brother   . Coronary artery disease Brother   . Diabetes Brother   . Hypertension Brother      Previous Medications   ALBUTEROL (PROVENTIL HFA;VENTOLIN HFA) 108 (90 BASE) MCG/ACT INHALER    Inhale 2 puffs into the lungs every 6 (six) hours.   CYCLOBENZAPRINE (FLEXERIL) 5 MG TABLET    Take 1 tablet (5 mg total) by mouth every 8 (eight) hours as needed for muscle spasms (or pain. Do not drive or operate machinery while taking as can cause drowsiness.).   ECONAZOLE NITRATE 1 % CREAM    1 application 2 (two) times daily.   FLUTICASONE (FLONASE) 50 MCG/ACT NASAL SPRAY    2 sprays daily.   GLIPIZIDE (GLUCOTROL) 5 MG TABLET    Take 5 mg by mouth daily.   GLUCOSE BLOOD (EXPRESS MED TEST STRIP PACK VI)    1 strip daily.   GLUCOSE BLOOD TEST STRIP    1 strip 3 (three) times daily.   IBUPROFEN (ADVIL,MOTRIN) 800 MG TABLET    Take 1 tablet by mouth 3 (three) times daily with meals.   INSULIN ASPART (NOVOLOG) 100  UNIT/ML FLEXPEN    Inject 5 Units into the skin 3 (three) times daily before meals.   INSULIN GLARGINE (LANTUS) 100 UNIT/ML SOLOSTAR PEN    Inject 40 Units into the skin daily.   LISINOPRIL (PRINIVIL,ZESTRIL) 20 MG TABLET    Take 1 tablet (20 mg total) by mouth daily.   METFORMIN (GLUCOPHAGE) 1000 MG TABLET    Take 1,000 mg by mouth 2 (two) times daily with a meal.   PIOGLITAZONE (ACTOS) 45 MG TABLET    1 tablet daily.    Review of Systems  Constitutional: Positive for fatigue.  HENT: Positive for postnasal drip and rhinorrhea.        Occurs when mowing.  Respiratory: Negative.   Cardiovascular: Negative.   Gastrointestinal: Negative.   Neurological: Positive for numbness and headaches.  Psychiatric/Behavioral: The patient is nervous/anxious.    Social History   Substance Use Topics  . Smoking status: Never Smoker   . Smokeless tobacco: Never Used  . Alcohol Use: No   Objective:   BP 144/96 mmHg  Pulse 80  Temp(Src) 98.2 F (36.8 C) (Oral)  Resp 16  Wt 222 lb 9.6 oz (100.971 kg)  SpO2 97%  Physical Exam  Constitutional: He is oriented to person, place, and time. He appears well-developed and well-nourished.  HENT:  Head: Normocephalic and atraumatic.  Right Ear: External ear normal.  Left Ear: External ear normal.  Nose: Nose normal.  Mouth/Throat: Oropharynx is clear and moist.  Eyes: EOM are normal. Pupils are equal, round, and reactive to light.  Neck: Normal range of motion. Neck supple.  Cardiovascular: Normal rate, regular rhythm and normal heart sounds.   Pulmonary/Chest: Effort normal and breath sounds normal.  Abdominal: Soft. Bowel sounds are normal.  Musculoskeletal: Normal range of motion.  Neurological: He is alert and oriented to person, place, and time. He has normal reflexes.  Skin: Skin is warm and dry.  Psychiatric: His mood appears anxious.      Assessment & Plan:        1. Episodic paroxysmal hemicrania, not intractable No headache/pain today. Onset 3 weeks ago without known injury. Check labs for infection or electrolyte imbalance. May need referral to neurologist and/or CT scan. - CBC with Differential/Platelet  2. Paresthesias Associated with recent headache and fatigue. Recheck labs for dehydration or electrolyte imbalance from working in the heat mowing yards recently. - Comprehensive metabolic panel  3. Essential (primary) hypertension Elevated BP and needs lisinopril refilled. Since we are unsure how long he has gone without it, will start at a lower dose than previous prescriptions. Recheck BP in 2 weeks here or at his pharmacy and call report of value.. - lisinopril (PRINIVIL,ZESTRIL) 30 MG tablet; Take 0.5 tablets (15 mg total) by mouth daily.  Dispense: 30 tablet; Refill: 2  4. Malaise and  fatigue Associated with headache and recent outdoor work mowing yards. Check routine labs. - CBC with Differential/Platelet - Comprehensive metabolic panel  5. Diabetes mellitus type 2, uncontrolled Recheck elevation of BS despite use of Lantus 40 units daily, Metformin 1000 mg BID, Actos 45 mg qd and Novolog 5 units before meals as directed by his endocrinologist. Continue follow up with Dr. Ronnald Collum to adjust diabetes medications.

## 2015-04-09 LAB — CBC WITH DIFFERENTIAL/PLATELET
BASOS: 0 %
Basophils Absolute: 0 10*3/uL (ref 0.0–0.2)
EOS (ABSOLUTE): 0.1 10*3/uL (ref 0.0–0.4)
EOS: 1 %
HEMATOCRIT: 40.7 % (ref 37.5–51.0)
Hemoglobin: 14 g/dL (ref 12.6–17.7)
Immature Grans (Abs): 0 10*3/uL (ref 0.0–0.1)
Immature Granulocytes: 0 %
LYMPHS ABS: 2.9 10*3/uL (ref 0.7–3.1)
Lymphs: 44 %
MCH: 28.6 pg (ref 26.6–33.0)
MCHC: 34.4 g/dL (ref 31.5–35.7)
MCV: 83 fL (ref 79–97)
Monocytes Absolute: 0.4 10*3/uL (ref 0.1–0.9)
Monocytes: 6 %
NEUTROS ABS: 3.3 10*3/uL (ref 1.4–7.0)
NEUTROS PCT: 49 %
Platelets: 261 10*3/uL (ref 150–379)
RBC: 4.9 x10E6/uL (ref 4.14–5.80)
RDW: 14.1 % (ref 12.3–15.4)
WBC: 6.6 10*3/uL (ref 3.4–10.8)

## 2015-04-09 LAB — COMPREHENSIVE METABOLIC PANEL
A/G RATIO: 1.9 (ref 1.1–2.5)
ALT: 23 IU/L (ref 0–44)
AST: 19 IU/L (ref 0–40)
Albumin: 4.5 g/dL (ref 3.6–4.8)
Alkaline Phosphatase: 46 IU/L (ref 39–117)
BUN/Creatinine Ratio: 15 (ref 10–22)
BUN: 14 mg/dL (ref 8–27)
Bilirubin Total: 0.2 mg/dL (ref 0.0–1.2)
CALCIUM: 9.9 mg/dL (ref 8.6–10.2)
CHLORIDE: 100 mmol/L (ref 97–108)
CO2: 24 mmol/L (ref 18–29)
Creatinine, Ser: 0.95 mg/dL (ref 0.76–1.27)
GFR calc Af Amer: 99 mL/min/{1.73_m2} (ref >59)
GFR, EST NON AFRICAN AMERICAN: 86 mL/min/{1.73_m2} (ref >59)
GLOBULIN, TOTAL: 2.4 g/dL (ref 1.5–4.5)
Glucose: 188 mg/dL — ABNORMAL HIGH (ref 65–99)
POTASSIUM: 4.2 mmol/L (ref 3.5–5.2)
SODIUM: 141 mmol/L (ref 134–144)
Total Protein: 6.9 g/dL (ref 6.0–8.5)

## 2015-04-10 ENCOUNTER — Telehealth: Payer: Self-pay

## 2015-04-10 NOTE — Telephone Encounter (Signed)
Patient advised as directed below.  Thanks,  -Joseline 

## 2015-04-10 NOTE — Telephone Encounter (Signed)
-----   Message from Margo Common, [place] sent at 04/10/2015  8:25 AM EDT ----- No sign of infection or significant dehydration. Normal electrolytes and no anemia. Blood sugar up to 188 that day. If headache and paresthesias continue, will need to schedule CT scan of head.

## 2015-04-10 NOTE — Telephone Encounter (Signed)
LMTCB  Thanks,  -Joseline 

## 2015-04-10 NOTE — Telephone Encounter (Signed)
Pt returned call. Thanks TNP °

## 2015-04-28 ENCOUNTER — Other Ambulatory Visit: Payer: Self-pay | Admitting: Family Medicine

## 2015-08-08 ENCOUNTER — Ambulatory Visit
Admission: RE | Admit: 2015-08-08 | Discharge: 2015-08-08 | Disposition: A | Discharge status: Home / Self Care | Payer: Medicare Other | Source: Ambulatory Visit | Attending: Family Medicine | Admitting: Family Medicine

## 2015-08-08 ENCOUNTER — Encounter: Payer: Self-pay | Admitting: Family Medicine

## 2015-08-08 ENCOUNTER — Ambulatory Visit (INDEPENDENT_AMBULATORY_CARE_PROVIDER_SITE_OTHER): Payer: Medicare Other | Admitting: Family Medicine

## 2015-08-08 VITALS — BP 142/90 | HR 108 | Temp 98.9°F | Resp 16 | Wt 223.0 lb

## 2015-08-08 DIAGNOSIS — M542 Cervicalgia: Secondary | ICD-10-CM

## 2015-08-08 DIAGNOSIS — M50322 Other cervical disc degeneration at C5-C6 level: Secondary | ICD-10-CM | POA: Insufficient documentation

## 2015-08-08 DIAGNOSIS — R208 Other disturbances of skin sensation: Secondary | ICD-10-CM | POA: Diagnosis not present

## 2015-08-08 DIAGNOSIS — M50323 Other cervical disc degeneration at C6-C7 level: Secondary | ICD-10-CM | POA: Insufficient documentation

## 2015-08-08 DIAGNOSIS — J069 Acute upper respiratory infection, unspecified: Secondary | ICD-10-CM | POA: Diagnosis not present

## 2015-08-08 DIAGNOSIS — R200 Anesthesia of skin: Secondary | ICD-10-CM

## 2015-08-08 DIAGNOSIS — M50321 Other cervical disc degeneration at C4-C5 level: Secondary | ICD-10-CM | POA: Insufficient documentation

## 2015-08-08 MED ORDER — IBUPROFEN 800 MG PO TABS
800.0000 mg | ORAL_TABLET | Freq: Three times a day (TID) | ORAL | Status: DC
Start: 2015-08-08 — End: 2016-10-20

## 2015-08-08 MED ORDER — CYCLOBENZAPRINE HCL 5 MG PO TABS: 5.0000 mg | ORAL_TABLET | Freq: Three times a day (TID) | ORAL | Status: DC | PRN

## 2015-08-08 NOTE — Progress Notes (Signed)
Patient ID: [name], male   DOB: [birth year], 59 y.o.   MRN: [id number]   Patient: [name] Male    DOB: 06-07-1956   59 y.o.   MRN: [id number] Visit Date: 08/08/2015  Today's Provider: Vernie Murders, PA   Chief Complaint  Patient presents with  . Neck Pain    X 3 weeks   Subjective:    Neck Pain  This is a new problem. The current episode started 1 to 4 weeks ago. The problem occurs intermittently (episode in August 2016 with return in the past 3 weeks). The pain is associated with nothing. The pain is present in the left side. Quality: "crackling" with stabbing pain to rotate head to the left. The pain is moderate. The symptoms are aggravated by twisting. Stiffness is present in the morning. Associated symptoms include headaches and numbness. Pertinent negatives include no syncope or weakness. Associated symptoms comments: Cough and headache. Initially had some numbness in toes - none in the past week. Diabetes medications have been adjusted/changed and FBS <150 the past 3 months (followed by Dr. Ronnald Collum)..   Patient Active Problem List   Diagnosis Date Noted  . Abdominal discomfort 01/07/2015  . Acute low back pain 01/07/2015  . Bronchitis 01/07/2015  . Confusion state 01/07/2015  . CN (constipation) 01/07/2015  . Cough, persistent 01/07/2015  . Dysthymic disorder 01/07/2015  . Breathlessness on exertion 01/07/2015  . Abdominal discomfort, epigastric 01/07/2015  . Essential (primary) hypertension 01/07/2015  . Malaise and fatigue 01/07/2015  . Suicidal ideation 01/07/2015  . Tension type headache 01/07/2015  . Pain of thigh 01/07/2015  . Chest pain 10/28/2011  . Diabetes mellitus 10/28/2011  . HTN (hypertension) 10/28/2011  . D (diarrhea) 10/17/2009  . Onychia of toe 01/31/2009  . Dermatophytic onychia 01/31/2009  . Diabetes mellitus type 2, uncontrolled ([place]) 06/02/2006   Past Surgical History  Procedure Laterality Date  . No past surgeries     Family  History  Problem Relation Age of Onset  . Cancer Father   . Diabetes Sister   . Hypertension Sister   . Heart attack Sister   . Seizures Brother   . Coronary artery disease Brother   . Diabetes Brother   . Hypertension Brother    No Known Allergies   Previous Medications   ALBUTEROL (PROVENTIL HFA;VENTOLIN HFA) 108 (90 BASE) MCG/ACT INHALER    Inhale 2 puffs into the lungs every 6 (six) hours. Reported on 08/08/2015   CYCLOBENZAPRINE (FLEXERIL) 5 MG TABLET    Take 1 tablet (5 mg total) by mouth every 8 (eight) hours as needed for muscle spasms (or pain. Do not drive or operate machinery while taking as can cause drowsiness.).   ECONAZOLE NITRATE 1 % CREAM    1 application 2 (two) times daily.   FLUTICASONE (FLONASE) 50 MCG/ACT NASAL SPRAY    USE 2 SPRAYS IN EACH NOSTRIL AT BEDTIME   GLIPIZIDE (GLUCOTROL) 5 MG TABLET    Take 5 mg by mouth daily.   GLUCOSE BLOOD (EXPRESS MED TEST STRIP PACK VI)    1 strip daily.   GLUCOSE BLOOD TEST STRIP    1 strip 3 (three) times daily.   IBUPROFEN (ADVIL,MOTRIN) 800 MG TABLET    Take 1 tablet by mouth 3 (three) times daily with meals.   INSULIN GLARGINE (LANTUS) 100 UNIT/ML SOLOSTAR PEN    Inject 40 Units into the skin daily.   LISINOPRIL (PRINIVIL,ZESTRIL) 30 MG TABLET    Take 0.5 tablets (15  mg total) by mouth daily.   METFORMIN (GLUCOPHAGE) 1000 MG TABLET    Take 1,000 mg by mouth 2 (two) times daily with a meal.   ONGLYZA 5 MG TABS TABLET    TK 1 T PO ONCE D   PIOGLITAZONE (ACTOS) 45 MG TABLET    1 tablet daily.    Review of Systems  Constitutional: Negative.   Eyes: Negative.   Respiratory: Positive for cough.   Cardiovascular: Negative.  Negative for syncope.  Gastrointestinal: Negative.   Endocrine: Negative.   Genitourinary: Negative.   Musculoskeletal: Positive for neck pain.  Skin: Negative.   Allergic/Immunologic: Negative.   Neurological: Positive for numbness and headaches. Negative for weakness.  Hematological: Negative.     Psychiatric/Behavioral: Negative.     Social History  Substance Use Topics  . Smoking status: Never Smoker   . Smokeless tobacco: Never Used  . Alcohol Use: No   Objective:   BP 142/90 mmHg  Pulse 108  Temp(Src) 98.9 F (37.2 C) (Oral)  Resp 16  Wt 223 lb (101.152 kg)  SpO2 97%  Physical Exam  Constitutional: He is oriented to person, place, and time. He appears well-developed and well-nourished.  HENT:  Head: Normocephalic.  Right Ear: External ear normal.  Left Ear: External ear normal.  Nose: Nose normal.  Mouth/Throat: Oropharynx is clear and moist.  Minimal irritation to posterior pharynx.  Neck:  Stabbing pain in left neck and trapezius muscle to rotate head to the left shoulder.  Cardiovascular: Normal rate, regular rhythm and normal heart sounds.   Pulmonary/Chest: Effort normal.  Abdominal: Soft. Bowel sounds are normal.  Musculoskeletal: He exhibits tenderness.  Tenderness in left occiput, top of left shoulder near [facility] joint and trapezius muscle belly.  Lymphadenopathy:    He has no cervical adenopathy.  Neurological: He is alert and oriented to person, place, and time. He has normal reflexes. No cranial nerve deficit. Coordination normal.  Skin: No rash noted.  Psychiatric: He has a normal mood and affect. His behavior is normal.      Assessment & Plan:     1. Neck pain on left side This episode started 2-3 weeks ago. Can't remember any specific injury. Has a similar episode in August 2016. With sensation of crepitus in neck when he rotates his head to the left, will order x-ray evaluation, refill Ibuprofen and cyclobenzaprine. May apply moist heat and recheck pending report of x-rays. - cyclobenzaprine (FLEXERIL) 5 MG tablet; Take 1 tablet (5 mg total) by mouth every 8 (eight) hours as needed for muscle spasms (or pain. Do not drive or operate machinery while taking as can cause drowsiness.).  Dispense: 12 tablet; Refill: 0 - ibuprofen (ADVIL,MOTRIN) 800 MG  tablet; Take 1 tablet (800 mg total) by mouth 3 (three) times daily with meals.  Dispense: 30 tablet; Refill: 2 - DG Cervical Spine Complete  2. Upper respiratory infection Onset over the past week. No fever. May use Mucinex-DM prn cough and congestion. Increase fluid intake and recheck prn.  3. Numbness of toes Intermittent. Suspect diabetic neuropathy. Advised to discuss with endocrinologist.

## 2015-08-10 ENCOUNTER — Emergency Department: Payer: Medicare Other

## 2015-08-10 ENCOUNTER — Encounter: Payer: Self-pay | Admitting: Emergency Medicine

## 2015-08-10 ENCOUNTER — Emergency Department
Admission: EM | Admit: 2015-08-10 | Discharge: 2015-08-10 | Disposition: A | Discharge status: Home / Self Care | Payer: Medicare Other | Attending: Student | Admitting: Student

## 2015-08-10 DIAGNOSIS — Z7984 Long term (current) use of oral hypoglycemic drugs: Secondary | ICD-10-CM | POA: Insufficient documentation

## 2015-08-10 DIAGNOSIS — Z79899 Other long term (current) drug therapy: Secondary | ICD-10-CM | POA: Diagnosis not present

## 2015-08-10 DIAGNOSIS — R079 Chest pain, unspecified: Secondary | ICD-10-CM

## 2015-08-10 DIAGNOSIS — J40 Bronchitis, not specified as acute or chronic: Secondary | ICD-10-CM | POA: Diagnosis not present

## 2015-08-10 DIAGNOSIS — R0789 Other chest pain: Secondary | ICD-10-CM | POA: Diagnosis present

## 2015-08-10 DIAGNOSIS — E119 Type 2 diabetes mellitus without complications: Secondary | ICD-10-CM | POA: Diagnosis not present

## 2015-08-10 DIAGNOSIS — Z7951 Long term (current) use of inhaled steroids: Secondary | ICD-10-CM | POA: Insufficient documentation

## 2015-08-10 DIAGNOSIS — I10 Essential (primary) hypertension: Secondary | ICD-10-CM | POA: Insufficient documentation

## 2015-08-10 DIAGNOSIS — Z794 Long term (current) use of insulin: Secondary | ICD-10-CM | POA: Insufficient documentation

## 2015-08-10 LAB — BASIC METABOLIC PANEL
Anion gap: 5 (ref 5–15)
BUN: 15 mg/dL (ref 6–20)
CHLORIDE: 104 mmol/L (ref 101–111)
CO2: 29 mmol/L (ref 22–32)
Calcium: 8.8 mg/dL — ABNORMAL LOW (ref 8.9–10.3)
Creatinine, Ser: 1.09 mg/dL (ref 0.61–1.24)
GFR calc Af Amer: >60 mL/min (ref >60)
GFR calc non Af Amer: >60 mL/min (ref >60)
Glucose, Bld: 174 mg/dL — ABNORMAL HIGH (ref 65–99)
Potassium: 3.7 mmol/L (ref 3.5–5.1)
SODIUM: 138 mmol/L (ref 135–145)

## 2015-08-10 LAB — CBC
HCT: 41.5 % (ref 40.0–52.0)
HEMOGLOBIN: 13.6 g/dL (ref 13.0–18.0)
MCH: 27.6 pg (ref 26.0–34.0)
MCHC: 32.7 g/dL (ref 32.0–36.0)
MCV: 84.3 fL (ref 80.0–100.0)
Platelets: 224 10*3/uL (ref 150–440)
RBC: 4.92 MIL/uL (ref 4.40–5.90)
RDW: 13.4 % (ref 11.5–14.5)
WBC: 6.1 10*3/uL (ref 3.8–10.6)

## 2015-08-10 LAB — TROPONIN I

## 2015-08-10 LAB — FIBRIN DERIVATIVES D-DIMER (ARMC ONLY): FIBRIN DERIVATIVES D-DIMER (ARMC): 571 — AB (ref 0–499)

## 2015-08-10 MED ORDER — ALBUTEROL SULFATE HFA 108 (90 BASE) MCG/ACT IN AERS: 2.0000 | INHALATION_SPRAY | Freq: Four times a day (QID) | RESPIRATORY_TRACT | Status: DC | PRN

## 2015-08-10 MED ORDER — OXYCODONE HCL 5 MG PO TABS: 5.0000 mg | ORAL_TABLET | Freq: Four times a day (QID) | ORAL | Status: DC | PRN

## 2015-08-10 MED ORDER — SODIUM CHLORIDE 0.9 % IV BOLUS (SEPSIS)
1000.0000 mL | Freq: Once | INTRAVENOUS | Status: AC
  Administered 2015-08-10: 1000 mL via INTRAVENOUS

## 2015-08-10 MED ORDER — ASPIRIN 81 MG PO CHEW
324.0000 mg | CHEWABLE_TABLET | Freq: Once | ORAL | Status: AC
  Administered 2015-08-10: 324 mg via ORAL
  Filled 2015-08-10: qty 4

## 2015-08-10 MED ORDER — IOHEXOL 350 MG/ML SOLN
75.0000 mL | Freq: Once | INTRAVENOUS | Status: AC | PRN
  Administered 2015-08-10: 75 mL via INTRAVENOUS

## 2015-08-10 NOTE — ED Notes (Signed)
Vicente Males, rn advised of pt's complaint and wait.

## 2015-08-10 NOTE — ED Provider Notes (Signed)
[facility] Emergency Department Provider Note  ____________________________________________  Time seen: Approximately 8:05 AM  I have reviewed the triage vital signs and the nursing notes.   HISTORY  Chief Complaint Nasal Congestion; Chest Pain; and Shortness of Breath    HPI [name] is a 59 y.o. male issue of diabetes, hypertension who presents for evaluation of right-sided chest pain constant since last night, gradual onset, worse with cough and deep inspiration, currently moderate. Patient reports that he has had 2 weeks of URI symptoms. He reports that last week his cold "moved into my chest". His pain is not worsened with exertion. No nausea, vomiting, diarrhea, fevers or chills. He has had mild shortness of breath.   Past Medical History  Diagnosis Date  . Diabetes mellitus   . Chest pain, unspecified     atypical  . Shortness of breath   . Hypertension   . Nausea & vomiting     Patient Active Problem List   Diagnosis Date Noted  . Abdominal discomfort 01/07/2015  . Acute low back pain 01/07/2015  . Bronchitis 01/07/2015  . Confusion state 01/07/2015  . CN (constipation) 01/07/2015  . Cough, persistent 01/07/2015  . Dysthymic disorder 01/07/2015  . Breathlessness on exertion 01/07/2015  . Abdominal discomfort, epigastric 01/07/2015  . Essential (primary) hypertension 01/07/2015  . Malaise and fatigue 01/07/2015  . Suicidal ideation 01/07/2015  . Tension type headache 01/07/2015  . Pain of thigh 01/07/2015  . Chest pain 10/28/2011  . Diabetes mellitus 10/28/2011  . HTN (hypertension) 10/28/2011  . D (diarrhea) 10/17/2009  . Onychia of toe 01/31/2009  . Dermatophytic onychia 01/31/2009  . Diabetes mellitus type 2, uncontrolled ([place]) 06/02/2006    Past Surgical History  Procedure Laterality Date  . No past surgeries      Current Outpatient Rx  Name  Route  Sig  Dispense  Refill  . albuterol (PROVENTIL HFA;VENTOLIN HFA) 108  (90 BASE) MCG/ACT inhaler   Inhalation   Inhale 2 puffs into the lungs every 6 (six) hours. Reported on 08/08/2015         . cyclobenzaprine (FLEXERIL) 5 MG tablet   Oral   Take 1 tablet (5 mg total) by mouth every 8 (eight) hours as needed for muscle spasms (or pain. Do not drive or operate machinery while taking as can cause drowsiness.).   12 tablet   0   . econazole nitrate 1 % cream      1 application 2 (two) times daily.         . fluticasone (FLONASE) 50 MCG/ACT nasal spray      USE 2 SPRAYS IN EACH NOSTRIL AT BEDTIME   48 g   3     **Patient requests 90 days supply**   . glipiZIDE (GLUCOTROL) 5 MG tablet   Oral   Take 5 mg by mouth daily.         . Glucose Blood (EXPRESS MED TEST STRIP PACK VI)      1 strip daily.         Marland Kitchen glucose blood test strip      1 strip 3 (three) times daily.         Marland Kitchen ibuprofen (ADVIL,MOTRIN) 800 MG tablet   Oral   Take 1 tablet (800 mg total) by mouth 3 (three) times daily with meals.   30 tablet   2   . Insulin Glargine (LANTUS) 100 UNIT/ML Solostar Pen   Subcutaneous   Inject 40 Units  into the skin daily.         Marland Kitchen lisinopril (PRINIVIL,ZESTRIL) 30 MG tablet   Oral   Take 0.5 tablets (15 mg total) by mouth daily.   30 tablet   2   . metFORMIN (GLUCOPHAGE) 1000 MG tablet   Oral   Take 1,000 mg by mouth 2 (two) times daily with a meal.         . ONGLYZA 5 MG TABS tablet      TK 1 T PO ONCE D      2     Dispense as written.   . pioglitazone (ACTOS) 45 MG tablet      1 tablet daily.      2     Allergies Review of patient's allergies indicates no known allergies.  Family History  Problem Relation Age of Onset  . Cancer Father   . Diabetes Sister   . Hypertension Sister   . Heart attack Sister   . Seizures Brother   . Coronary artery disease Brother   . Diabetes Brother   . Hypertension Brother     Social History Social History  Substance Use Topics  . Smoking status: Never Smoker   .  Smokeless tobacco: Never Used  . Alcohol Use: No    Review of Systems Constitutional: No fever/chills Eyes: No visual changes. ENT: No sore throat. Cardiovascular: + chest pain. Respiratory: +shortness of breath. Gastrointestinal: No abdominal pain.  No nausea, no vomiting.  No diarrhea.  No constipation. Genitourinary: Negative for dysuria. Musculoskeletal: Negative for back pain. Skin: Negative for rash. Neurological: Negative for headaches, focal weakness or numbness.  10-point ROS otherwise negative.  ____________________________________________   PHYSICAL EXAM:  VITAL SIGNS: ED Triage Vitals  Enc Vitals Group     BP 08/10/15 0647 174/93 mmHg     Pulse Rate 08/10/15 0647 94     Resp 08/10/15 0647 18     Temp 08/10/15 0647 98.2 F (36.8 C)     Temp Source 08/10/15 0647 Oral     SpO2 08/10/15 0647 96 %     Weight 08/10/15 0645 220 lb (99.791 kg)     Height 08/10/15 0645 5\' 11"  (1.803 m)     Head Cir --      Peak Flow --      Pain Score --      Pain Loc --      Pain Edu? --      Excl. in [place]? --     Constitutional: Alert and oriented. Well appearing and in no acute distress. Eyes: Conjunctivae are normal. PERRL. EOMI. Head: Atraumatic. Nose: No congestion/rhinnorhea. Mouth/Throat: Mucous membranes are moist.  Oropharynx non-erythematous. Neck: No stridor.   Cardiovascular: Normal rate, regular rhythm. Grossly normal heart sounds.  Good peripheral circulation. Respiratory: Normal respiratory effort.  No retractions. Lungs CTAB. Gastrointestinal: Soft and nontender. No distention.  No CVA tenderness. Genitourinary: deferred Musculoskeletal: No lower extremity tenderness nor edema.  No joint effusions. No tenderness to palpation throughout the right chest wall however patient does experience worsening chest pain when he rotates his body to the left. Neurologic:  Normal speech and language. No gross focal neurologic deficits are appreciated. No gait  instability. Skin:  Skin is warm, dry and intact. No rash noted. Psychiatric: Mood and affect are normal. Speech and behavior are normal.  ____________________________________________   LABS (all labs ordered are listed, but only abnormal results are displayed)  Labs Reviewed  BASIC METABOLIC PANEL - Abnormal; Notable for  the following:    Glucose, Bld 174 (*)    Calcium 8.8 (*)    All other components within normal limits  FIBRIN DERIVATIVES D-DIMER ([facility] ONLY) - Abnormal; Notable for the following:    Fibrin derivatives D-dimer ([facility]) 571 (*)    All other components within normal limits  CBC  TROPONIN I   ____________________________________________  EKG  ED ECG REPORT I, Joanne Gavel, the attending physician, personally viewed and interpreted this ECG.   Date: 08/10/2015  EKG Time: 06:41  Rate: 90  Rhythm: normal sinus rhythm  Axis: normal  Intervals:none  ST&T Change: No acute ST elevation. Q-wave in V2.  ____________________________________________  RADIOLOGY  CXR IMPRESSION: No active disease.  CTA chest IMPRESSION: No evidence of pulmonary embolism.  Bronchitic changes with minimal bibasilar atelectasis and old granulomatous disease. ____________________________________________   PROCEDURES  Procedure(s) performed: None  Critical Care performed: No  ____________________________________________   INITIAL IMPRESSION / ASSESSMENT AND PLAN / ED COURSE  Pertinent labs & imaging results that were available during my care of the patient were reviewed by me and considered in my medical decision making (see chart for details).  [name] is a 59 y.o. male issue of diabetes, hypertension who presents for evaluation of right-sided chest pain constant since last night, gradual onset, worse with cough and deep inspiration, currently moderate. On exam, he is very well-appearing and in no acute distress. Vital signs are stable, he is afebrile. Labs  reviewed. Negative troponin, normal CBC and BMP. Given chest pain ongoing since last night/constitutional/night, reassuring EKG, negative troponin, I doubt ACS in this patient. D-dimer was obtained given the pleuritic nature of the patient's pain and his age and it is elevated, we'll pursue CTA chest to rule out PE though clinically I suspect his pain is more secondary to rib strain given it is worse with movement and cough.  Reassess for disposition.  ----------------------------------------- 10:49 AM on 08/10/2015 -----------------------------------------  Anticoagulation continues to appear well. He is resting comfortably. CTA chest negative for evidence of PE, no aortic aneurysm or dissection. There are bronchitic changes noted. We'll DC with albuterol inhaler as well as short course of oxycodone for likely muscular skeletal chest pain. ____________________________________________   FINAL CLINICAL IMPRESSION(S) / ED DIAGNOSES  Final diagnoses:  Right-sided chest pain  Bronchitis      Joanne Gavel, MD 08/10/15 1050

## 2015-08-10 NOTE — ED Notes (Addendum)
Pt states is short of breath and has right sided chest pain. Pt appears in no acute distress. Pt to triage room 3 to have blood drawn.

## 2015-08-10 NOTE — ED Notes (Signed)
Pt c/o right-sided chest/rib pain with coughing.

## 2015-08-10 NOTE — ED Notes (Signed)
Patient states that he has had congestion times three weeks. Patient states that he was seen at his pcp and was told to take metamucil.

## 2015-08-12 ENCOUNTER — Other Ambulatory Visit: Payer: Self-pay

## 2015-08-14 ENCOUNTER — Inpatient Hospital Stay: Payer: Medicare Other | Admitting: Family Medicine

## 2015-08-25 ENCOUNTER — Other Ambulatory Visit: Payer: Self-pay | Admitting: Family Medicine

## 2015-08-29 ENCOUNTER — Inpatient Hospital Stay: Payer: Self-pay | Admitting: Family Medicine

## 2015-09-02 ENCOUNTER — Encounter: Payer: Self-pay | Admitting: Family Medicine

## 2015-09-02 ENCOUNTER — Ambulatory Visit (INDEPENDENT_AMBULATORY_CARE_PROVIDER_SITE_OTHER): Payer: Medicare Other | Admitting: Family Medicine

## 2015-09-02 VITALS — BP 152/88 | HR 94 | Temp 98.5°F | Resp 16 | Wt 221.8 lb

## 2015-09-02 DIAGNOSIS — Z23 Encounter for immunization: Secondary | ICD-10-CM | POA: Diagnosis not present

## 2015-09-02 DIAGNOSIS — J40 Bronchitis, not specified as acute or chronic: Secondary | ICD-10-CM

## 2015-09-02 MED ORDER — ALBUTEROL SULFATE HFA 108 (90 BASE) MCG/ACT IN AERS: 2.0000 | INHALATION_SPRAY | Freq: Four times a day (QID) | RESPIRATORY_TRACT | Status: DC | PRN

## 2015-09-02 NOTE — Patient Instructions (Signed)
Bronchospasm, Adult A bronchospasm is when the tubes that carry air in and out of your lungs (airways) spasm or tighten. During a bronchospasm it is hard to breathe. This is because the airways get smaller. A bronchospasm can be triggered by:  Allergies. These may be to animals, pollen, food, or mold.  Infection. This is a common cause of bronchospasm.  Exercise.  Irritants. These include pollution, cigarette smoke, strong odors, aerosol sprays, and paint fumes.  Weather changes.  Stress.  Being emotional. HOME CARE   Always have a plan for getting help. Know when to call your doctor and local emergency services (911 in the [place] Know where you can get emergency care.  Only take medicines as told by your doctor.  If you were prescribed an inhaler or nebulizer machine, ask your doctor how to use it correctly. Always use a spacer with your inhaler if you were given one.  Stay calm during an attack. Try to relax and breathe more slowly.  Control your home environment:  Change your heating and air conditioning filter at least once a month.  Limit your use of fireplaces and wood stoves.  Do not  smoke. Do not  allow smoking in your home.  Avoid perfumes and fragrances.  Get rid of pests (such as roaches and mice) and their droppings.  Throw away plants if you see mold on them.  Keep your house clean and dust free.  Replace carpet with wood, tile, or vinyl flooring. Carpet can trap dander and dust.  Use allergy-proof pillows, mattress covers, and box spring covers.  Wash bed sheets and blankets every week in hot water. Dry them in a dryer.  Use blankets that are made of polyester or cotton.  Wash hands frequently. GET HELP IF:  You have muscle aches.  You have chest pain.  The thick spit you spit or cough up (sputum) changes from clear or white to yellow, green, gray, or bloody.  The thick spit you spit or cough up gets thicker.  There are problems that may be  related to the medicine you are given such as:  A rash.  Itching.  Swelling.  Trouble breathing. GET HELP RIGHT AWAY IF:  You feel you cannot breathe or catch your breath.  You cannot stop coughing.  Your treatment is not helping you breathe better.  You have very bad chest pain. MAKE SURE YOU:   Understand these instructions.  Will watch your condition.  Will get help right away if you are not doing well or get worse.   This information is not intended to replace advice given to you by your health care provider. Make sure you discuss any questions you have with your health care provider.   Document Released: 06/06/2009 Document Revised: 08/30/2014 Document Reviewed: 01/30/2013 Elsevier Interactive Patient Education 2016 [employer] Inc.  

## 2015-09-02 NOTE — Progress Notes (Signed)
Patient ID: [name], male   DOB: Mar 28, 1956, 60 y.o.   MRN: [id number]   Patient: [name] Male    DOB: [birth year]   60 y.o.   MRN: [id number] Visit Date: 09/02/2015  Today's Provider: Vernie Murders, PA   Chief Complaint  Patient presents with  . Hospitalization Follow-up   Subjective:    Cough The current episode started 1 to 4 weeks ago.   Patient was seen at [facility] ER on 08/10/2015. Patient was diagnosed with Bronchitis with musculoskeletal chest pain and prescribed Albuterol and Oxycodone for rib pain due to coughing. Had normal EKG and CTA scan of chest did not show any PE. Patient states he feels better, but still coughing occasionally and relieved by ProAir. Had another episode of coughing last night. Some rare yellow sputum after coughing a lot.   Patient Active Problem List   Diagnosis Date Noted  . Abdominal discomfort 01/07/2015  . Acute low back pain 01/07/2015  . Bronchitis 01/07/2015  . Confusion state 01/07/2015  . CN (constipation) 01/07/2015  . Cough, persistent 01/07/2015  . Dysthymic disorder 01/07/2015  . Breathlessness on exertion 01/07/2015  . Abdominal discomfort, epigastric 01/07/2015  . Essential (primary) hypertension 01/07/2015  . Malaise and fatigue 01/07/2015  . Suicidal ideation 01/07/2015  . Tension type headache 01/07/2015  . Pain of thigh 01/07/2015  . Chest pain 10/28/2011  . Diabetes mellitus 10/28/2011  . HTN (hypertension) 10/28/2011  . D (diarrhea) 10/17/2009  . Onychia of toe 01/31/2009  . Dermatophytic onychia 01/31/2009  . Diabetes mellitus type 2, uncontrolled ([place]) 06/02/2006   Past Surgical History  Procedure Laterality Date  . No past surgeries     Family History  Problem Relation Age of Onset  . Cancer Father   . Diabetes Sister   . Hypertension Sister   . Heart attack Sister   . Seizures Brother   . Coronary artery disease Brother   . Diabetes Brother   . Hypertension Brother    No Known Allergies     Previous Medications   ALBUTEROL (PROVENTIL HFA;VENTOLIN HFA) 108 (90 BASE) MCG/ACT INHALER    Inhale 2 puffs into the lungs every 6 (six) hours as needed for wheezing or shortness of breath.   CYCLOBENZAPRINE (FLEXERIL) 5 MG TABLET    Take 1 tablet (5 mg total) by mouth every 8 (eight) hours as needed for muscle spasms (or pain. Do not drive or operate machinery while taking as can cause drowsiness.).   ECONAZOLE NITRATE 1 % CREAM    1 application 2 (two) times daily.   FLUTICASONE (FLONASE) 50 MCG/ACT NASAL SPRAY    USE 2 SPRAYS IN EACH NOSTRIL AT BEDTIME   GLIPIZIDE (GLUCOTROL) 10 MG TABLET    TK 1 T PO BID   GLUCOSE BLOOD (EXPRESS MED TEST STRIP PACK VI)    1 strip daily.   GLUCOSE BLOOD TEST STRIP    1 strip 3 (three) times daily.   IBUPROFEN (ADVIL,MOTRIN) 800 MG TABLET    Take 1 tablet (800 mg total) by mouth 3 (three) times daily with meals.   INSULIN GLARGINE (LANTUS) 100 UNIT/ML SOLOSTAR PEN    Inject 40 Units into the skin daily.   LISINOPRIL (PRINIVIL,ZESTRIL) 30 MG TABLET    TAKE 1/2 TABLET(15 MG) BY MOUTH DAILY   METFORMIN (GLUCOPHAGE) 1000 MG TABLET    Take 1,000 mg by mouth 2 (two) times daily with a meal.   ONGLYZA 5 MG TABS TABLET    TK 1  T PO ONCE D   OXYCODONE (ROXICODONE) 5 MG IMMEDIATE RELEASE TABLET    Take 1 tablet (5 mg total) by mouth every 6 (six) hours as needed for moderate pain. Do not drive while taking this medication.   PIOGLITAZONE (ACTOS) 45 MG TABLET    1 tablet daily.    Review of Systems  Constitutional: Negative.   HENT: Negative.   Eyes: Negative.   Respiratory: Positive for cough.   Cardiovascular: Negative.   Gastrointestinal: Negative.   Endocrine: Negative.   Genitourinary: Negative.   Musculoskeletal: Negative.   Skin: Negative.   Allergic/Immunologic: Negative.   Neurological: Negative.   Hematological: Negative.   Psychiatric/Behavioral: Negative.     Social History  Substance Use Topics  . Smoking status: Never Smoker   .  Smokeless tobacco: Never Used  . Alcohol Use: No   Objective:   BP 152/88 mmHg  Pulse 94  Temp(Src) 98.5 F (36.9 C) (Oral)  Resp 16  Wt 221 lb 12.8 oz (100.608 kg)  SpO2 97%  Physical Exam  Constitutional: He is oriented to person, place, and time. He appears well-developed and well-nourished.  HENT:  Head: Normocephalic.  Right Ear: External ear normal.  Left Ear: External ear normal.  Nose: Nose normal.  Mouth/Throat: Oropharynx is clear and moist.  Eyes: Conjunctivae and EOM are normal.  Neck: Normal range of motion. Neck supple.  Cardiovascular: Normal rate and regular rhythm.   Pulmonary/Chest: Effort normal and breath sounds normal. He exhibits no tenderness.  Abdominal: Soft. Bowel sounds are normal.  Neurological: He is alert and oriented to person, place, and time.  Skin: No rash noted.  Psychiatric: He has a normal mood and affect. His behavior is normal.      Assessment & Plan:     1. Bronchitis Resolving bronchitis with chest wall muscle strain and wheezing. May use Mucinex-DM prn cough and refilled Proventil. Increase fluid intake and recheck if no better in 10-14 days. Advised to limit cold exposure which can trigger more wheezing. - albuterol (PROVENTIL HFA;VENTOLIN HFA) 108 (90 Base) MCG/ACT inhaler; Inhale 2 puffs into the lungs every 6 (six) hours as needed for wheezing or shortness of breath.  Dispense: 1 Inhaler; Refill: 3  2. Need for influenza vaccination - Flu Vaccine QUAD 36+ mos PF IM (Fluarix & Fluzone Quad PF)

## 2015-09-04 DIAGNOSIS — E669 Obesity, unspecified: Secondary | ICD-10-CM | POA: Diagnosis not present

## 2015-09-04 DIAGNOSIS — F3289 Other specified depressive episodes: Secondary | ICD-10-CM | POA: Diagnosis not present

## 2015-09-04 DIAGNOSIS — E785 Hyperlipidemia, unspecified: Secondary | ICD-10-CM | POA: Diagnosis not present

## 2015-09-04 DIAGNOSIS — I10 Essential (primary) hypertension: Secondary | ICD-10-CM | POA: Diagnosis not present

## 2015-09-04 DIAGNOSIS — B369 Superficial mycosis, unspecified: Secondary | ICD-10-CM | POA: Diagnosis not present

## 2015-09-04 DIAGNOSIS — E118 Type 2 diabetes mellitus with unspecified complications: Secondary | ICD-10-CM | POA: Diagnosis not present

## 2015-10-08 DIAGNOSIS — B369 Superficial mycosis, unspecified: Secondary | ICD-10-CM | POA: Diagnosis not present

## 2015-10-08 DIAGNOSIS — I10 Essential (primary) hypertension: Secondary | ICD-10-CM | POA: Diagnosis not present

## 2015-10-08 DIAGNOSIS — E785 Hyperlipidemia, unspecified: Secondary | ICD-10-CM | POA: Diagnosis not present

## 2015-10-08 DIAGNOSIS — F3289 Other specified depressive episodes: Secondary | ICD-10-CM | POA: Diagnosis not present

## 2015-10-08 DIAGNOSIS — E669 Obesity, unspecified: Secondary | ICD-10-CM | POA: Diagnosis not present

## 2015-10-08 DIAGNOSIS — E118 Type 2 diabetes mellitus with unspecified complications: Secondary | ICD-10-CM | POA: Diagnosis not present

## 2015-10-08 DIAGNOSIS — E1165 Type 2 diabetes mellitus with hyperglycemia: Secondary | ICD-10-CM | POA: Diagnosis not present

## 2015-10-27 ENCOUNTER — Other Ambulatory Visit: Payer: Self-pay | Admitting: Family Medicine

## 2015-10-27 ENCOUNTER — Ambulatory Visit (INDEPENDENT_AMBULATORY_CARE_PROVIDER_SITE_OTHER): Payer: Medicare Other | Admitting: Family Medicine

## 2015-10-27 ENCOUNTER — Encounter: Payer: Self-pay | Admitting: Family Medicine

## 2015-10-27 VITALS — BP 158/88 | HR 88 | Temp 98.5°F | Resp 16 | Wt 225.2 lb

## 2015-10-27 DIAGNOSIS — R059 Cough, unspecified: Secondary | ICD-10-CM

## 2015-10-27 DIAGNOSIS — R05 Cough: Secondary | ICD-10-CM

## 2015-10-27 DIAGNOSIS — J4520 Mild intermittent asthma, uncomplicated: Secondary | ICD-10-CM

## 2015-10-27 MED ORDER — BENZONATATE 100 MG PO CAPS: 100.0000 mg | ORAL_CAPSULE | Freq: Three times a day (TID) | ORAL | Status: DC | PRN

## 2015-10-27 MED ORDER — ALBUTEROL SULFATE HFA 108 (90 BASE) MCG/ACT IN AERS: INHALATION_SPRAY | RESPIRATORY_TRACT | Status: DC

## 2015-10-27 NOTE — Progress Notes (Signed)
Patient ID: [name], male   DOB: 07/17/1956, 60 y.o.   MRN: [id number]   Patient: [name] Male    DOB: 1956-05-18   60 y.o.   MRN: [id number] Visit Date: 10/27/2015  Today's Provider: Vernie Murders, PA   Chief Complaint  Patient presents with  . Cough   Subjective:    Cough This is a new problem. The current episode started 1 to 4 weeks ago. The problem has been unchanged. The problem occurs every few minutes (worse at night). The cough is non-productive. His past medical history is significant for bronchitis.  Some itchy nose and wheezing with tightness in chest. Has run out of Proventil which helped in January 2017.  Patient Active Problem List   Diagnosis Date Noted  . Abdominal discomfort 01/07/2015  . Acute low back pain 01/07/2015  . Bronchitis 01/07/2015  . Confusion state 01/07/2015  . CN (constipation) 01/07/2015  . Cough, persistent 01/07/2015  . Dysthymic disorder 01/07/2015  . Breathlessness on exertion 01/07/2015  . Abdominal discomfort, epigastric 01/07/2015  . Essential (primary) hypertension 01/07/2015  . Malaise and fatigue 01/07/2015  . Suicidal ideation 01/07/2015  . Tension type headache 01/07/2015  . Pain of thigh 01/07/2015  . Chest pain 10/28/2011  . Diabetes mellitus 10/28/2011  . HTN (hypertension) 10/28/2011  . D (diarrhea) 10/17/2009  . Onychia of toe 01/31/2009  . Dermatophytic onychia 01/31/2009  . Diabetes mellitus type 2, uncontrolled ([place]) 06/02/2006   Past Surgical History  Procedure Laterality Date  . No past surgeries     Family History  Problem Relation Age of Onset  . Cancer Father   . Diabetes Sister   . Hypertension Sister   . Heart attack Sister   . Seizures Brother   . Coronary artery disease Brother   . Diabetes Brother   . Hypertension Brother    Previous Medications   CYCLOBENZAPRINE (FLEXERIL) 5 MG TABLET    Take 1 tablet (5 mg total) by mouth every 8 (eight) hours as needed for muscle spasms (or pain. Do  not drive or operate machinery while taking as can cause drowsiness.).   ECONAZOLE NITRATE 1 % CREAM    1 application 2 (two) times daily.   FLUTICASONE (FLONASE) 50 MCG/ACT NASAL SPRAY    USE 2 SPRAYS IN EACH NOSTRIL AT BEDTIME   GLIPIZIDE (GLUCOTROL) 10 MG TABLET    TK 1 T PO BID   GLUCOSE BLOOD (EXPRESS MED TEST STRIP PACK VI)    1 strip daily.   GLUCOSE BLOOD TEST STRIP    1 strip 3 (three) times daily.   IBUPROFEN (ADVIL,MOTRIN) 800 MG TABLET    Take 1 tablet (800 mg total) by mouth 3 (three) times daily with meals.   INSULIN GLARGINE (LANTUS) 100 UNIT/ML SOLOSTAR PEN    Inject 40 Units into the skin daily.   LISINOPRIL (PRINIVIL,ZESTRIL) 30 MG TABLET    TAKE 1/2 TABLET(15 MG) BY MOUTH DAILY   METFORMIN (GLUCOPHAGE) 1000 MG TABLET    Take 1,000 mg by mouth 2 (two) times daily with a meal.   ONGLYZA 5 MG TABS TABLET    TK 1 T PO ONCE D   OXYCODONE (ROXICODONE) 5 MG IMMEDIATE RELEASE TABLET    Take 1 tablet (5 mg total) by mouth every 6 (six) hours as needed for moderate pain. Do not drive while taking this medication.   PIOGLITAZONE (ACTOS) 45 MG TABLET    1 tablet daily.   PROAIR HFA 108 (90 BASE)  MCG/ACT INHALER    INHALE 2 PUFFS BY MOUTH EVERY 6 HOURS AS NEEDED FOR WHEEZING OR SHORTNESS OF BREATH   No Known Allergies  Review of Systems  Constitutional: Negative.   HENT: Negative.   Eyes: Negative.   Respiratory: Positive for cough.   Cardiovascular: Negative.   Gastrointestinal: Negative.   Endocrine: Negative.   Genitourinary: Negative.   Musculoskeletal: Negative.   Skin: Negative.   Allergic/Immunologic: Negative.   Neurological: Negative.   Hematological: Negative.   Psychiatric/Behavioral: Negative.     Social History  Substance Use Topics  . Smoking status: Never Smoker   . Smokeless tobacco: Never Used  . Alcohol Use: No   Objective:   BP 158/88 mmHg  Pulse 88  Temp(Src) 98.5 F (36.9 C) (Oral)  Resp 16  Wt 225 lb 3.2 oz (102.15 kg)  Physical Exam    Constitutional: He is oriented to person, place, and time. He appears well-developed and well-nourished.  HENT:  Head: Normocephalic.  Right Ear: External ear normal.  Left Ear: External ear normal.  Mouth/Throat: Oropharynx is clear and moist.  Eyes: Conjunctivae and EOM are normal.  Neck: Normal range of motion. Neck supple.  Cardiovascular: Normal rate, regular rhythm and normal heart sounds.   Pulmonary/Chest: Effort normal and breath sounds normal.  Abdominal: Soft. Bowel sounds are normal.  Neurological: He is alert and oriented to person, place, and time.      Assessment & Plan:      1. Cough Persistent cough over the past 2-4 weeks. Ticklish and non-productive. No fever. Will give Tessalon Perles to try for cough suppression. Increase fluid intake and recheck prn. - benzonatate (TESSALON) 100 MG capsule; Take 1 capsule (100 mg total) by mouth 3 (three) times daily as needed for cough.  Dispense: 30 capsule; Refill: 0  2. Reactive airway disease, mild intermittent, uncomplicated Similar cough, in January 2017, responded well to the ProAir-HFA and requests refill.  - albuterol (PROAIR HFA) 108 (90 Base) MCG/ACT inhaler; INHALE 2 PUFFS BY MOUTH EVERY 6 HOURS AS NEEDED FOR WHEEZING OR SHORTNESS OF BREATH  Dispense: 8.5 g; Refill: 3

## 2015-11-05 DIAGNOSIS — E669 Obesity, unspecified: Secondary | ICD-10-CM | POA: Diagnosis not present

## 2015-11-05 DIAGNOSIS — E1165 Type 2 diabetes mellitus with hyperglycemia: Secondary | ICD-10-CM | POA: Diagnosis not present

## 2015-11-05 DIAGNOSIS — E118 Type 2 diabetes mellitus with unspecified complications: Secondary | ICD-10-CM | POA: Diagnosis not present

## 2015-11-05 DIAGNOSIS — F3289 Other specified depressive episodes: Secondary | ICD-10-CM | POA: Diagnosis not present

## 2015-11-05 DIAGNOSIS — B369 Superficial mycosis, unspecified: Secondary | ICD-10-CM | POA: Diagnosis not present

## 2015-11-05 DIAGNOSIS — I10 Essential (primary) hypertension: Secondary | ICD-10-CM | POA: Diagnosis not present

## 2015-11-05 DIAGNOSIS — E785 Hyperlipidemia, unspecified: Secondary | ICD-10-CM | POA: Diagnosis not present

## 2015-11-06 ENCOUNTER — Other Ambulatory Visit: Payer: Self-pay | Admitting: Family Medicine

## 2015-11-06 DIAGNOSIS — J4520 Mild intermittent asthma, uncomplicated: Secondary | ICD-10-CM

## 2015-12-04 DIAGNOSIS — E118 Type 2 diabetes mellitus with unspecified complications: Secondary | ICD-10-CM | POA: Diagnosis not present

## 2015-12-10 DIAGNOSIS — E118 Type 2 diabetes mellitus with unspecified complications: Secondary | ICD-10-CM | POA: Diagnosis not present

## 2015-12-10 DIAGNOSIS — E1165 Type 2 diabetes mellitus with hyperglycemia: Secondary | ICD-10-CM | POA: Diagnosis not present

## 2015-12-10 DIAGNOSIS — I10 Essential (primary) hypertension: Secondary | ICD-10-CM | POA: Diagnosis not present

## 2015-12-10 DIAGNOSIS — B369 Superficial mycosis, unspecified: Secondary | ICD-10-CM | POA: Diagnosis not present

## 2015-12-10 DIAGNOSIS — E669 Obesity, unspecified: Secondary | ICD-10-CM | POA: Diagnosis not present

## 2015-12-10 DIAGNOSIS — E785 Hyperlipidemia, unspecified: Secondary | ICD-10-CM | POA: Diagnosis not present

## 2015-12-10 DIAGNOSIS — F3289 Other specified depressive episodes: Secondary | ICD-10-CM | POA: Diagnosis not present

## 2016-02-10 DIAGNOSIS — E669 Obesity, unspecified: Secondary | ICD-10-CM | POA: Diagnosis not present

## 2016-02-10 DIAGNOSIS — F3289 Other specified depressive episodes: Secondary | ICD-10-CM | POA: Diagnosis not present

## 2016-02-10 DIAGNOSIS — I10 Essential (primary) hypertension: Secondary | ICD-10-CM | POA: Diagnosis not present

## 2016-02-10 DIAGNOSIS — E785 Hyperlipidemia, unspecified: Secondary | ICD-10-CM | POA: Diagnosis not present

## 2016-02-10 DIAGNOSIS — E118 Type 2 diabetes mellitus with unspecified complications: Secondary | ICD-10-CM | POA: Diagnosis not present

## 2016-02-10 DIAGNOSIS — B369 Superficial mycosis, unspecified: Secondary | ICD-10-CM | POA: Diagnosis not present

## 2016-02-10 DIAGNOSIS — E1165 Type 2 diabetes mellitus with hyperglycemia: Secondary | ICD-10-CM | POA: Diagnosis not present

## 2016-03-15 ENCOUNTER — Ambulatory Visit (INDEPENDENT_AMBULATORY_CARE_PROVIDER_SITE_OTHER): Payer: Medicare Other | Admitting: Family Medicine

## 2016-03-15 ENCOUNTER — Encounter: Payer: Self-pay | Admitting: Family Medicine

## 2016-03-15 VITALS — BP 170/80 | HR 76 | Temp 97.9°F | Resp 16 | Wt 225.0 lb

## 2016-03-15 DIAGNOSIS — K921 Melena: Secondary | ICD-10-CM

## 2016-03-15 DIAGNOSIS — N529 Male erectile dysfunction, unspecified: Secondary | ICD-10-CM | POA: Diagnosis not present

## 2016-03-15 DIAGNOSIS — R351 Nocturia: Secondary | ICD-10-CM

## 2016-03-15 DIAGNOSIS — E1142 Type 2 diabetes mellitus with diabetic polyneuropathy: Secondary | ICD-10-CM

## 2016-03-15 DIAGNOSIS — L80 Vitiligo: Secondary | ICD-10-CM | POA: Diagnosis not present

## 2016-03-15 DIAGNOSIS — G629 Polyneuropathy, unspecified: Secondary | ICD-10-CM | POA: Insufficient documentation

## 2016-03-15 LAB — POCT URINALYSIS DIPSTICK
BILIRUBIN UA: NEGATIVE
Glucose, UA: 250
KETONES UA: NEGATIVE
Leukocytes, UA: NEGATIVE
Nitrite, UA: NEGATIVE
PH UA: 5
Protein, UA: NEGATIVE
RBC UA: NEGATIVE
Spec Grav, UA: 1.02
Urobilinogen, UA: NEGATIVE

## 2016-03-15 LAB — POC HEMOCCULT BLD/STL (OFFICE/1-CARD/DIAGNOSTIC): Fecal Occult Blood, POC: POSITIVE — AB

## 2016-03-15 MED ORDER — TAMSULOSIN HCL 0.4 MG PO CAPS: 0.4000 mg | ORAL_CAPSULE | Freq: Every day | ORAL | 3 refills | Status: DC

## 2016-03-15 NOTE — Progress Notes (Signed)
[name]  MRN: [id number] DOB: July 22, 1956  Subjective:  HPI   The patient is a 60 year old male who presents for evaluation of numbness in his toes and discoloration of his fingertips.  He states that he has had the issues for about 3-4 months.  The numbness in only in the toes and worse at night.  He states he gets pain in the feet that at times will wake him from his sleep. He also notes that the tips of all his fingers and thumbs have lost the pigmentation.  He states he has no pain, numbness of temperature intolerance with the skin changes.  He just notes that they are changing. The patient states that he has also had some urinary leaking.  He states that he urinates and feels he has completely emptied his bladder and then notes that he has to change his underwear at least twice a day because he can smell urine.  He states that he does not feel it when he leaks the urine.  There is no pain, urgency, blood or other discomfort associated with the urinary leakage.  Patient Active Problem List   Diagnosis Date Noted  . Abdominal discomfort 01/07/2015  . Acute low back pain 01/07/2015  . Bronchitis 01/07/2015  . Confusion state 01/07/2015  . CN (constipation) 01/07/2015  . Cough, persistent 01/07/2015  . Dysthymic disorder 01/07/2015  . Breathlessness on exertion 01/07/2015  . Abdominal discomfort, epigastric 01/07/2015  . Essential (primary) hypertension 01/07/2015  . Malaise and fatigue 01/07/2015  . Suicidal ideation 01/07/2015  . Tension type headache 01/07/2015  . Pain of thigh 01/07/2015  . Chest pain 10/28/2011  . Diabetes mellitus 10/28/2011  . HTN (hypertension) 10/28/2011  . D (diarrhea) 10/17/2009  . Onychia of toe 01/31/2009  . Dermatophytic onychia 01/31/2009  . Diabetes mellitus type 2, uncontrolled ([name]) 06/02/2006    Past Medical History:  Diagnosis Date  . Chest pain, unspecified    atypical  . Diabetes mellitus   . Hypertension   . Nausea & vomiting    . Shortness of breath     Social History   Social History  . Marital status: Married    Spouse name: [name]  . Number of children: [name]  . Years of education: [name]   Occupational History  . Not on file.   Social History Main Topics  . Smoking status: Never Smoker  . Smokeless tobacco: Never Used  . Alcohol use No  . Drug use: No  . Sexual activity: Not on file   Other Topics Concern  . Not on file   Social History Narrative  . No narrative on file    Outpatient Medications Prior to Visit  Medication Sig Dispense Refill  . albuterol (PROAIR HFA) 108 (90 Base) MCG/ACT inhaler INHALE 2 PUFFS BY MOUTH EVERY 6 HOURS AS NEEDED FOR WHEEZING OR SHORTNESS OF BREATH 8.5 g 3  . cyclobenzaprine (FLEXERIL) 5 MG tablet Take 1 tablet (5 mg total) by mouth every 8 (eight) hours as needed for muscle spasms (or pain. Do not drive or operate machinery while taking as can cause drowsiness.). 12 tablet 0  . econazole nitrate 1 % cream 1 application 2 (two) times daily.    . fluticasone (FLONASE) 50 MCG/ACT nasal spray USE 2 SPRAYS IN EACH NOSTRIL AT BEDTIME 48 g 3  . glipiZIDE (GLUCOTROL) 10 MG tablet TK 1 T PO BID  2  . Glucose Blood (EXPRESS MED TEST STRIP PACK VI) 1 strip  daily.    . glucose blood test strip 1 strip 3 (three) times daily.    Marland Kitchen ibuprofen (ADVIL,MOTRIN) 800 MG tablet Take 1 tablet (800 mg total) by mouth 3 (three) times daily with meals. 30 tablet 2  . Insulin Glargine (LANTUS) 100 UNIT/ML Solostar Pen Inject 40 Units into the skin daily.    Marland Kitchen lisinopril (PRINIVIL,ZESTRIL) 30 MG tablet TAKE 1/2 TABLET(15 MG) BY MOUTH DAILY 30 tablet 12  . metFORMIN (GLUCOPHAGE) 1000 MG tablet Take 1,000 mg by mouth 2 (two) times daily with a meal.    . ONGLYZA 5 MG TABS tablet TK 1 T PO ONCE D  2  . oxyCODONE (ROXICODONE) 5 MG immediate release tablet Take 1 tablet (5 mg total) by mouth every 6 (six) hours as needed for moderate pain. Do not drive while taking this medication. 12 tablet 0  .  pioglitazone (ACTOS) 45 MG tablet 1 tablet daily.  2  . benzonatate (TESSALON) 100 MG capsule Take 1 capsule (100 mg total) by mouth 3 (three) times daily as needed for cough. 30 capsule 0   No facility-administered medications prior to visit.     No Known Allergies  Review of Systems  Constitutional: Positive for chills and fever.  Respiratory: Negative for cough, shortness of breath and wheezing.   Cardiovascular: Negative for chest pain, palpitations, orthopnea and leg swelling.  Gastrointestinal: Negative for abdominal pain, blood in stool, constipation, diarrhea, heartburn, melena, nausea and vomiting.  Genitourinary: Negative for dysuria, flank pain, frequency, hematuria and urgency.       Weak stream  Skin: Negative for itching and rash.       Color changes   Objective:  BP (!) 170/80 (BP Location: Right Arm, Patient Position: Sitting, Cuff Size: Normal)   Pulse 76   Temp 97.9 F (36.6 C) (Oral)   Resp 16   Wt 225 lb (102.1 kg)   BMI 31.38 kg/m   Physical Exam  Constitutional: He is oriented to person, place, and time and well-developed, well-nourished, and in no distress.  HENT:  Head: Normocephalic.  Eyes: Conjunctivae are normal.  Neck: Neck supple.  Cardiovascular: Normal rate and regular rhythm.   Pulmonary/Chest: Breath sounds normal.  Abdominal: Soft. Bowel sounds are normal.  Genitourinary: Penis normal. Rectal exam shows guaiac positive stool. No discharge found.  Genitourinary Comments: No prostate nodule appreciated on DRE.  Musculoskeletal: Normal range of motion.  Neurological: He is alert and oriented to person, place, and time.  Decreased sensation in toes. No ulcerations or callus formation.  Skin: Rash noted.  Pigment loss in fingers and toes distally. No itch or discomfort.    Assessment and Plan :  1. Diabetic peripheral neuropathy (HCC) FBS 120-170 on Lantus, Actos, Glipizide, Onglyza and Metformin as prescribed by Dr. Ronnald Collum. Has been  having tingling numbness in both feet/toes. Has not had podiatry screening. Will schedule podiatry referral. Continue follow up with Dr. Ronnald Collum as planned. - Ambulatory referral to Podiatry  2. Vitiligo Loss of pigment in distal fingers and toes over the past few weeks. No discomfort. Schedule dermatology referral. - Ambulatory referral to Dermatology  3. Nocturia Getting up 2-3 times a night and some leaking of urine. No dysuria or frequency during the day. Urinalysis did not have signs of infection. Will give trial of Flomax. Recheck progress in 4-6 weeks. - POCT urinalysis dipstick - tamsulosin (FLOMAX) 0.4 MG CAPS capsule; Take 1 capsule (0.4 mg total) by mouth daily.  Dispense: 30 capsule; Refill: 3  4. Blood in stool DRE showed questionable positive Hemoccult slide. No tarry stools or hemorrhoidal bleeding/discomfort. Will give OC-Light kit to test stool from home. May need colonoscopy evaluation if positive. - POC Hemoccult Bld/Stl (1-Cd Office Dx)  5. Erectile dysfunction, unspecified erectile dysfunction type Having difficulty attaining and maintaining erections throughout intercourse. Will give trial of Viagra 50 mg 1 tablet 1-4 hours prior to intercourse. Suspect secondary to diabetes. Recheck progress in 4-6 weeks.   [facility] 03/15/2016 10:09 AM

## 2016-03-19 ENCOUNTER — Telehealth: Payer: Self-pay

## 2016-03-19 NOTE — Telephone Encounter (Signed)
Advise patient test is positive for blood in stool and he needs to schedule referral to gastroenterologist for colonoscopy.

## 2016-03-19 NOTE — Telephone Encounter (Signed)
Patient brought in OC lite test and results were positive. Patient has not been advised.

## 2016-03-30 DIAGNOSIS — L851 Acquired keratosis [keratoderma] palmaris et plantaris: Secondary | ICD-10-CM | POA: Diagnosis not present

## 2016-03-30 DIAGNOSIS — B351 Tinea unguium: Secondary | ICD-10-CM | POA: Diagnosis not present

## 2016-03-30 DIAGNOSIS — E1140 Type 2 diabetes mellitus with diabetic neuropathy, unspecified: Secondary | ICD-10-CM | POA: Diagnosis not present

## 2016-03-30 DIAGNOSIS — Z794 Long term (current) use of insulin: Secondary | ICD-10-CM | POA: Diagnosis not present

## 2016-05-06 DIAGNOSIS — E1165 Type 2 diabetes mellitus with hyperglycemia: Secondary | ICD-10-CM | POA: Diagnosis not present

## 2016-05-10 DIAGNOSIS — L80 Vitiligo: Secondary | ICD-10-CM | POA: Diagnosis not present

## 2016-05-11 DIAGNOSIS — F3289 Other specified depressive episodes: Secondary | ICD-10-CM | POA: Diagnosis not present

## 2016-05-11 DIAGNOSIS — E118 Type 2 diabetes mellitus with unspecified complications: Secondary | ICD-10-CM | POA: Diagnosis not present

## 2016-05-11 DIAGNOSIS — Z23 Encounter for immunization: Secondary | ICD-10-CM | POA: Diagnosis not present

## 2016-05-11 DIAGNOSIS — I10 Essential (primary) hypertension: Secondary | ICD-10-CM | POA: Diagnosis not present

## 2016-05-11 DIAGNOSIS — B369 Superficial mycosis, unspecified: Secondary | ICD-10-CM | POA: Diagnosis not present

## 2016-05-11 DIAGNOSIS — E785 Hyperlipidemia, unspecified: Secondary | ICD-10-CM | POA: Diagnosis not present

## 2016-05-11 DIAGNOSIS — E669 Obesity, unspecified: Secondary | ICD-10-CM | POA: Diagnosis not present

## 2016-05-11 DIAGNOSIS — E1165 Type 2 diabetes mellitus with hyperglycemia: Secondary | ICD-10-CM | POA: Diagnosis not present

## 2016-06-30 DIAGNOSIS — E1140 Type 2 diabetes mellitus with diabetic neuropathy, unspecified: Secondary | ICD-10-CM | POA: Diagnosis not present

## 2016-06-30 DIAGNOSIS — Z794 Long term (current) use of insulin: Secondary | ICD-10-CM | POA: Diagnosis not present

## 2016-06-30 DIAGNOSIS — B351 Tinea unguium: Secondary | ICD-10-CM | POA: Diagnosis not present

## 2016-07-05 ENCOUNTER — Other Ambulatory Visit: Payer: Self-pay | Admitting: Family Medicine

## 2016-08-09 DIAGNOSIS — E669 Obesity, unspecified: Secondary | ICD-10-CM | POA: Diagnosis not present

## 2016-08-09 DIAGNOSIS — L80 Vitiligo: Secondary | ICD-10-CM | POA: Diagnosis not present

## 2016-08-09 DIAGNOSIS — E1165 Type 2 diabetes mellitus with hyperglycemia: Secondary | ICD-10-CM | POA: Diagnosis not present

## 2016-08-09 DIAGNOSIS — E785 Hyperlipidemia, unspecified: Secondary | ICD-10-CM | POA: Diagnosis not present

## 2016-08-12 DIAGNOSIS — E118 Type 2 diabetes mellitus with unspecified complications: Secondary | ICD-10-CM | POA: Diagnosis not present

## 2016-08-12 DIAGNOSIS — F3289 Other specified depressive episodes: Secondary | ICD-10-CM | POA: Diagnosis not present

## 2016-08-12 DIAGNOSIS — E1165 Type 2 diabetes mellitus with hyperglycemia: Secondary | ICD-10-CM | POA: Diagnosis not present

## 2016-08-12 DIAGNOSIS — I10 Essential (primary) hypertension: Secondary | ICD-10-CM | POA: Diagnosis not present

## 2016-08-12 DIAGNOSIS — B369 Superficial mycosis, unspecified: Secondary | ICD-10-CM | POA: Diagnosis not present

## 2016-08-12 DIAGNOSIS — E669 Obesity, unspecified: Secondary | ICD-10-CM | POA: Diagnosis not present

## 2016-08-12 DIAGNOSIS — E785 Hyperlipidemia, unspecified: Secondary | ICD-10-CM | POA: Diagnosis not present

## 2016-08-18 DIAGNOSIS — E113393 Type 2 diabetes mellitus with moderate nonproliferative diabetic retinopathy without macular edema, bilateral: Secondary | ICD-10-CM | POA: Diagnosis not present

## 2016-08-18 LAB — HM DIABETES EYE EXAM

## 2016-09-14 DIAGNOSIS — F3289 Other specified depressive episodes: Secondary | ICD-10-CM | POA: Diagnosis not present

## 2016-09-14 DIAGNOSIS — E118 Type 2 diabetes mellitus with unspecified complications: Secondary | ICD-10-CM | POA: Diagnosis not present

## 2016-09-14 DIAGNOSIS — E1165 Type 2 diabetes mellitus with hyperglycemia: Secondary | ICD-10-CM | POA: Diagnosis not present

## 2016-09-14 DIAGNOSIS — E785 Hyperlipidemia, unspecified: Secondary | ICD-10-CM | POA: Diagnosis not present

## 2016-09-14 DIAGNOSIS — E669 Obesity, unspecified: Secondary | ICD-10-CM | POA: Diagnosis not present

## 2016-09-14 DIAGNOSIS — I10 Essential (primary) hypertension: Secondary | ICD-10-CM | POA: Diagnosis not present

## 2016-09-14 DIAGNOSIS — B369 Superficial mycosis, unspecified: Secondary | ICD-10-CM | POA: Diagnosis not present

## 2016-09-24 ENCOUNTER — Other Ambulatory Visit: Payer: Self-pay | Admitting: Family Medicine

## 2016-09-24 DIAGNOSIS — I10 Essential (primary) hypertension: Secondary | ICD-10-CM

## 2016-09-30 DIAGNOSIS — E1140 Type 2 diabetes mellitus with diabetic neuropathy, unspecified: Secondary | ICD-10-CM | POA: Diagnosis not present

## 2016-09-30 DIAGNOSIS — Z794 Long term (current) use of insulin: Secondary | ICD-10-CM | POA: Diagnosis not present

## 2016-09-30 DIAGNOSIS — B351 Tinea unguium: Secondary | ICD-10-CM | POA: Diagnosis not present

## 2016-09-30 DIAGNOSIS — L851 Acquired keratosis [keratoderma] palmaris et plantaris: Secondary | ICD-10-CM | POA: Diagnosis not present

## 2016-10-20 ENCOUNTER — Other Ambulatory Visit: Payer: Self-pay | Admitting: Family Medicine

## 2016-10-20 DIAGNOSIS — M542 Cervicalgia: Secondary | ICD-10-CM

## 2016-11-22 ENCOUNTER — Other Ambulatory Visit: Payer: Self-pay | Admitting: Family Medicine

## 2016-11-22 DIAGNOSIS — M542 Cervicalgia: Secondary | ICD-10-CM

## 2016-12-03 ENCOUNTER — Telehealth: Payer: Self-pay | Admitting: Family Medicine

## 2016-12-03 NOTE — Telephone Encounter (Signed)
Need follow up appointment to assess diabetes and have some documentation before making a new referral. Need the name of the doctor he has been seeing about his diabetes.

## 2016-12-03 NOTE — Telephone Encounter (Signed)
Patient advised. Follow up appointment scheduled for Monday. He is currently seeing Dr. Ronnald Collum.

## 2016-12-03 NOTE — Telephone Encounter (Signed)
Pt is requesting a new Endo referral b/c he doesn't feel that the doctor he has been seeing at [facility] treat isn't working. Pt is requesting to see Dr. Gabriel Carina with [facility]. Please advise. Thanks TNP

## 2016-12-06 ENCOUNTER — Encounter: Payer: Self-pay | Admitting: Family Medicine

## 2016-12-06 ENCOUNTER — Ambulatory Visit (INDEPENDENT_AMBULATORY_CARE_PROVIDER_SITE_OTHER): Payer: Medicare Other | Admitting: Family Medicine

## 2016-12-06 VITALS — BP 164/104 | HR 98 | Temp 97.7°F | Wt 242.6 lb

## 2016-12-06 DIAGNOSIS — Z114 Encounter for screening for human immunodeficiency virus [HIV]: Secondary | ICD-10-CM

## 2016-12-06 DIAGNOSIS — E1100 Type 2 diabetes mellitus with hyperosmolarity without nonketotic hyperglycemic-hyperosmolar coma (NKHHC): Secondary | ICD-10-CM | POA: Diagnosis not present

## 2016-12-06 DIAGNOSIS — L80 Vitiligo: Secondary | ICD-10-CM | POA: Diagnosis not present

## 2016-12-06 DIAGNOSIS — I10 Essential (primary) hypertension: Secondary | ICD-10-CM

## 2016-12-06 DIAGNOSIS — Z1159 Encounter for screening for other viral diseases: Secondary | ICD-10-CM

## 2016-12-06 DIAGNOSIS — Z794 Long term (current) use of insulin: Secondary | ICD-10-CM | POA: Diagnosis not present

## 2016-12-06 LAB — POCT UA - MICROALBUMIN: Microalbumin Ur, POC: 50 mg/L

## 2016-12-06 NOTE — Progress Notes (Signed)
Patient: [name] Male    DOB: 09-Jan-1956   61 y.o.   MRN: [id number] Visit Date: 12/06/2016  Today's Provider: Vernie Murders, PA   Chief Complaint  Patient presents with  . Diabetes  . Hypertension  . Follow-up   Subjective:    HPI  Diabetes Mellitus Type II, Follow-up:   Lab Results  Component Value Date   HGBA1C 12.2 (A) 10/04/2013   HGBA1C SEE COMMENT 11/28/2011   Last seen for diabetes 9 months ago.  Management since then includes continue Lantus, Glipizide, Onglyza, and Metformin. Patient is currently seeing Dr. Ronnald Collum. He reports good compliance with treatment. He is not having side effects.  Current symptoms include none  Home blood sugar records: fasting range: 145 this morning  Episodes of hypoglycemia? no   Current Insulin Regimen: Lantus Weight trend: stable Current diet: diabetic Current exercise: walking and yard work  ------------------------------------------------------------------------   Hypertension, follow-up:  BP Readings from Last 3 Encounters:  12/06/16 (!) 164/104  03/15/16 (!) 170/80  10/27/15 (!) 158/88    He was last seen for hypertension 9 months ago.  BP at that visit was 170/80. Management since that visit includes continue medications.He reports good compliance with treatment. He is not having side effects.  He is exercising. He is adherent to low salt diet.   Outside blood pressures are being checked. He is experiencing none.  Patient denies chest pain, chest pressure/discomfort, irregular heart beat and palpitations.   Cardiovascular risk factors include advanced age (older than 91 for men, 17 for women), diabetes mellitus, hypertension and male gender.  Use of agents associated with hypertension: none.   ------------------------------------------------------------------------  Past Medical History:  Diagnosis Date  . Chest pain, unspecified    atypical  . Diabetes mellitus   . Hypertension   . Nausea &  vomiting   . Shortness of breath    Patient Active Problem List   Diagnosis Date Noted  . Peripheral neuropathy 03/15/2016  . Diabetic peripheral neuropathy ([place]) 03/15/2016  . Vitiligo 03/15/2016  . Nocturia 03/15/2016  . Blood in stool 03/15/2016  . Abdominal discomfort 01/07/2015  . Acute low back pain 01/07/2015  . Bronchitis 01/07/2015  . Confusion state 01/07/2015  . CN (constipation) 01/07/2015  . Cough, persistent 01/07/2015  . Dysthymic disorder 01/07/2015  . Breathlessness on exertion 01/07/2015  . Abdominal discomfort, epigastric 01/07/2015  . Essential (primary) hypertension 01/07/2015  . Malaise and fatigue 01/07/2015  . Suicidal ideation 01/07/2015  . Tension type headache 01/07/2015  . Pain of thigh 01/07/2015  . Chest pain 10/28/2011  . Diabetes mellitus ([place]) 10/28/2011  . HTN (hypertension) 10/28/2011  . D (diarrhea) 10/17/2009  . Onychia of toe 01/31/2009  . Dermatophytic onychia 01/31/2009  . Diabetes mellitus type 2, uncontrolled ([place]) 06/02/2006   Past Surgical History:  Procedure Laterality Date  . NO PAST SURGERIES     Family History  Problem Relation Age of Onset  . Cancer Father   . Diabetes Sister   . Hypertension Sister   . Heart attack Sister   . Seizures Brother   . Coronary artery disease Brother   . Diabetes Brother   . Hypertension Brother    No Known Allergies  Previous Medications   ALBUTEROL (PROAIR HFA) 108 (90 BASE) MCG/ACT INHALER    INHALE 2 PUFFS BY MOUTH EVERY 6 HOURS AS NEEDED FOR WHEEZING OR SHORTNESS OF BREATH   ECONAZOLE NITRATE 1 % CREAM    1 application 2 (two) times daily.  FLUTICASONE (FLONASE) 50 MCG/ACT NASAL SPRAY    USE 2 SPRAYS IN EACH NOSTRIL EVERY NIGHT AT BEDTIME   GLIPIZIDE (GLUCOTROL) 10 MG TABLET    TK 1 T PO BID   GLUCOSE BLOOD (EXPRESS MED TEST STRIP PACK VI)    1 strip daily.   GLUCOSE BLOOD TEST STRIP    1 strip 3 (three) times daily.   INSULIN GLARGINE (LANTUS) 100 UNIT/ML SOLOSTAR PEN    Inject  60 Units into the skin 1 day or 1 dose.   INSULIN LISPRO (HUMALOG) 100 UNIT/ML INJECTION    Inject 5 Units into the skin 2 (two) times daily.   LISINOPRIL (PRINIVIL,ZESTRIL) 30 MG TABLET    TAKE 1/2 TABLET BY MOUTH EVERY DAY   METFORMIN (GLUCOPHAGE) 1000 MG TABLET    Take 1,000 mg by mouth 2 (two) times daily with a meal.   ONGLYZA 5 MG TABS TABLET    TK 1 T PO ONCE D    Review of Systems  Constitutional: Negative.   Respiratory: Negative.   Cardiovascular: Negative.     Social History  Substance Use Topics  . Smoking status: Never Smoker  . Smokeless tobacco: Never Used  . Alcohol use No   Objective:   BP (!) 164/104 (BP Location: Right Arm, Patient Position: Sitting, Cuff Size: Large)   Pulse 98   Temp 97.7 F (36.5 C) (Oral)   Wt 242 lb 9.6 oz (110 kg)   SpO2 99%   BMI 33.84 kg/m   Wt Readings from Last 3 Encounters:  12/06/16 242 lb 9.6 oz (110 kg)  03/15/16 225 lb (102.1 kg)  10/27/15 225 lb 3.2 oz (102.2 kg)    Physical Exam  Constitutional: He is oriented to person, place, and time. He appears well-developed and well-nourished.  HENT:  Head: Normocephalic and atraumatic.  Right Ear: External ear normal.  Left Ear: External ear normal.  Nose: Nose normal.  Mouth/Throat: Oropharynx is clear and moist.  Eyes: Conjunctivae and EOM are normal. Pupils are equal, round, and reactive to light. Right eye exhibits no discharge.  Neck: Normal range of motion. Neck supple. No tracheal deviation present. No thyromegaly present.  Cardiovascular: Normal rate, regular rhythm, normal heart sounds and intact distal pulses.   No murmur heard. Pulmonary/Chest: Effort normal and breath sounds normal. No respiratory distress. He has no wheezes. He has no rales. He exhibits no tenderness.  Abdominal: Soft. He exhibits no distension and no mass. There is no tenderness. There is no rebound and no guarding.  Musculoskeletal: Normal range of motion. He exhibits no edema or tenderness.    Lymphadenopathy:    He has no cervical adenopathy.  Neurological: He is alert and oriented to person, place, and time. He has normal reflexes. No cranial nerve deficit. He exhibits normal muscle tone. Coordination normal.  Skin: Skin is warm and dry. Rash noted. No erythema.  Spots of total loss of pigment in fingers/hands.  Psychiatric: He has a normal mood and affect. His behavior is normal. Judgment and thought content normal.      Assessment & Plan:     1. Essential (primary) hypertension Still taking Lisinopril 30 mg 1/2 tablet daily but BP very high today. Recommend salt/sodium intake restriction and increase BP to 1 whole tablet of Lisinopril. Check routine labs and recheck BP in a month pending lab reports. - CBC with Differential/Platelet - Comprehensive metabolic panel - Lipid panel - TSH  2. Type 2 diabetes mellitus with hyperosmolarity without coma, with  long-term current use of insulin (HCC) Feeling diabetes is stable with rare hypoglycemic episodes. Presently on Lantus 60 units each evening, Humalog 5 units breakfast and supper, Glipizide 10 mg BID, Metformin 1000 mg BID and Onglyza 5 mg qd. Normal foot exam today. Has an appointment with podiatrist (Dr. Cleda Mccreedy) on 12-30-16 and ophthalmologist appointment to be scheduled soon. Recommend regular exercise 30 minutes 4-5 days/wk and follow the diabetic diet. Will get follow up labs. Request referral to Dr. Gabriel Carina as he does not want to see Dr. Ronnald Collum again. Follow up pending lab reports. - CBC with Differential/Platelet - Comprehensive metabolic panel - Lipid panel - Hemoglobin A1c - POCT UA - Microalbumin  3. Vitiligo Stable and followed by dermatologist regularly.  4. Need for hepatitis C screening test - Hepatitis C Antibody  5. Screening for HIV (human immunodeficiency virus) - HIV antibody

## 2016-12-06 NOTE — Patient Instructions (Signed)
Hypertension Hypertension, commonly called high blood pressure, is when the force of blood pumping through the arteries is too strong. The arteries are the blood vessels that carry blood from the heart throughout the body. Hypertension forces the heart to work harder to pump blood and may cause arteries to become narrow or stiff. Having untreated or uncontrolled hypertension can cause heart attacks, strokes, kidney disease, and other problems. A blood pressure reading consists of a higher number over a lower number. Ideally, your blood pressure should be below 120/80. The first ("top") number is called the systolic pressure. It is a measure of the pressure in your arteries as your heart beats. The second ("bottom") number is called the diastolic pressure. It is a measure of the pressure in your arteries as the heart relaxes. What are the causes? The cause of this condition is not known. What increases the risk? Some risk factors for high blood pressure are under your control. Others are not. Factors you can change   Smoking.  Having type 2 diabetes mellitus, high cholesterol, or both.  Not getting enough exercise or physical activity.  Being overweight.  Having too much fat, sugar, calories, or salt (sodium) in your diet.  Drinking too much alcohol. Factors that are difficult or impossible to change   Having chronic kidney disease.  Having a family history of high blood pressure.  Age. Risk increases with age.  Race. You may be at higher risk if you are African-American.  Gender. Men are at higher risk than women before age 45. After age 65, women are at higher risk than men.  Having obstructive sleep apnea.  Stress. What are the signs or symptoms? Extremely high blood pressure (hypertensive crisis) may cause:  Headache.  Anxiety.  Shortness of breath.  Nosebleed.  Nausea and vomiting.  Severe chest pain.  Jerky movements you cannot control (seizures). How is this  diagnosed? This condition is diagnosed by measuring your blood pressure while you are seated, with your arm resting on a surface. The cuff of the blood pressure monitor will be placed directly against the skin of your upper arm at the level of your heart. It should be measured at least twice using the same arm. Certain conditions can cause a difference in blood pressure between your right and left arms. Certain factors can cause blood pressure readings to be lower or higher than normal (elevated) for a short period of time:  When your blood pressure is higher when you are in a health care provider's office than when you are at home, this is called white coat hypertension. Most people with this condition do not need medicines.  When your blood pressure is higher at home than when you are in a health care provider's office, this is called masked hypertension. Most people with this condition may need medicines to control blood pressure. If you have a high blood pressure reading during one visit or you have normal blood pressure with other risk factors:  You may be asked to return on a different day to have your blood pressure checked again.  You may be asked to monitor your blood pressure at home for 1 week or longer. If you are diagnosed with hypertension, you may have other blood or imaging tests to help your health care provider understand your overall risk for other conditions. How is this treated? This condition is treated by making healthy lifestyle changes, such as eating healthy foods, exercising more, and reducing your alcohol intake. Your health   care provider may prescribe medicine if lifestyle changes are not enough to get your blood pressure under control, and if:  Your systolic blood pressure is above 130.  Your diastolic blood pressure is above 80. Your personal target blood pressure may vary depending on your medical conditions, your age, and other factors. Follow these instructions  at home: Eating and drinking   Eat a diet that is high in fiber and potassium, and low in sodium, added sugar, and fat. An example eating plan is called the DASH (Dietary Approaches to Stop Hypertension) diet. To eat this way:  Eat plenty of fresh fruits and vegetables. Try to fill half of your plate at each meal with fruits and vegetables.  Eat whole grains, such as whole wheat pasta, brown rice, or whole grain bread. Fill about one quarter of your plate with whole grains.  Eat or drink low-fat dairy products, such as skim milk or low-fat yogurt.  Avoid fatty cuts of meat, processed or cured meats, and poultry with skin. Fill about one quarter of your plate with lean proteins, such as fish, chicken without skin, beans, eggs, and tofu.  Avoid premade and processed foods. These tend to be higher in sodium, added sugar, and fat.  Reduce your daily sodium intake. Most people with hypertension should eat less than 1,500 mg of sodium a day.  Limit alcohol intake to no more than 1 drink a day for nonpregnant women and 2 drinks a day for men. One drink equals 12 oz of beer, 5 oz of wine, or 1 oz of hard liquor. Lifestyle   Work with your health care provider to maintain a healthy body weight or to lose weight. Ask what an ideal weight is for you.  Get at least 30 minutes of exercise that causes your heart to beat faster (aerobic exercise) most days of the week. Activities may include walking, swimming, or biking.  Include exercise to strengthen your muscles (resistance exercise), such as pilates or lifting weights, as part of your weekly exercise routine. Try to do these types of exercises for 30 minutes at least 3 days a week.  Do not use any products that contain nicotine or tobacco, such as cigarettes and e-cigarettes. If you need help quitting, ask your health care provider.  Monitor your blood pressure at home as told by your health care provider.  Keep all follow-up visits as told by  your health care provider. This is important. Medicines   Take over-the-counter and prescription medicines only as told by your health care provider. Follow directions carefully. Blood pressure medicines must be taken as prescribed.  Do not skip doses of blood pressure medicine. Doing this puts you at risk for problems and can make the medicine less effective.  Ask your health care provider about side effects or reactions to medicines that you should watch for. Contact a health care provider if:  You think you are having a reaction to a medicine you are taking.  You have headaches that keep coming back (recurring).  You feel dizzy.  You have swelling in your ankles.  You have trouble with your vision. Get help right away if:  You develop a severe headache or confusion.  You have unusual weakness or numbness.  You feel faint.  You have severe pain in your chest or abdomen.  You vomit repeatedly.  You have trouble breathing. Summary  Hypertension is when the force of blood pumping through your arteries is too strong. If this condition is   not controlled, it may put you at risk for serious complications.  Your personal target blood pressure may vary depending on your medical conditions, your age, and other factors. For most people, a normal blood pressure is less than 120/80.  Hypertension is treated with lifestyle changes, medicines, or a combination of both. Lifestyle changes include weight loss, eating a healthy, low-sodium diet, exercising more, and limiting alcohol. This information is not intended to replace advice given to you by your health care provider. Make sure you discuss any questions you have with your health care provider. Document Released: 08/09/2005 Document Revised: 07/07/2016 Document Reviewed: 07/07/2016 Elsevier Interactive Patient Education  2017 [employer] Type 2 Diabetes Mellitus, Diagnosis, Adult Type 2 diabetes (type 2 diabetes mellitus) is a  long-term (chronic) disease. In type 2 diabetes, one or both of these problems may be present:  The pancreas does not make enough of a hormone called insulin.  Cells in the body do not respond properly to insulin that the body makes (insulin resistance). Normally, insulin allows blood sugar (glucose) to enter cells in the body. The cells use glucose for energy. Insulin resistance or lack of insulin causes excess glucose to build up in the blood instead of going into cells. As a result, high blood glucose (hyperglycemia) develops. What increases the risk? The following factors may make you more likely to develop type 2 diabetes:  Having a family member with type 2 diabetes.  Being overweight or obese.  Having an inactive (sedentary) lifestyle.  Having been diagnosed with insulin resistance.  Having a history of prediabetes, gestational diabetes, or polycystic ovarian syndrome (PCOS).  Being of American-Indian, African-American, Hispanic/Latino, or Asian/Pacific Islander descent. What are the signs or symptoms? In the early stage of this condition, you may not have symptoms. Symptoms develop slowly and may include:  Increased thirst (polydipsia).  Increased hunger(polyphagia).  Increased urination (polyuria).  Increased urination during the night (nocturia).  Unexplained weight loss.  Frequent infections that keep coming back (recurring).  Fatigue.  Weakness.  Vision changes, such as blurry vision.  Cuts or bruises that are slow to heal.  Tingling or numbness in the hands or feet.  Dark patches on the skin (acanthosis nigricans). How is this diagnosed?   This condition is diagnosed based on your symptoms, your medical history, a physical exam, and your blood glucose level. Your blood glucose may be checked with one or more of the following blood tests:  A fasting blood glucose (FBG) test. You will not be allowed to eat (you will fast) for at least 8 hours before a  blood sample is taken.  A random blood glucose test. This checks blood glucose at any time of day regardless of when you ate.  An A1c (hemoglobin A1c) blood test. This provides information about blood glucose control over the previous 2-3 months.  An oral glucose tolerance test (OGTT). This measures your blood glucose at two times:  After fasting. This is your baseline blood glucose level.  Two hours after drinking a beverage that contains glucose. You may be diagnosed with type 2 diabetes if:  Your FBG level is 126 mg/dL (7.0 mmol/L) or higher.  Your random blood glucose level is 200 mg/dL (11.1 mmol/L) or higher.  Your A1c level is 6.5% or higher.  Your OGGT result is higher than 200 mg/dL (11.1 mmol/L). These blood tests may be repeated to confirm your diagnosis. How is this treated?   Your treatment may be managed by a specialist called an  endocrinologist. Type 2 diabetes may be treated by following instructions from your health care provider about:  Making diet and lifestyle changes. This may include:  Following an individualized nutrition plan that is developed by a diet and nutrition specialist (registered dietitian).  Exercising regularly.  Finding ways to manage stress.  Checking your blood glucose level as often as directed.  Taking diabetes medicines or insulin daily. This helps to keep your blood glucose levels in the healthy range.  If you use insulin, you may need to adjust the dosage depending on how physically active you are and what foods you eat. Your health care provider will tell you how to adjust your dosage.  Taking medicines to help prevent complications from diabetes, such as:  Aspirin.  Medicine to lower cholesterol.  Medicine to control blood pressure. Your health care provider will set individualized treatment goals for you. Your goals will be based on your age, other medical conditions you have, and how you respond to diabetes treatment.  Generally, the goal of treatment is to maintain the following blood glucose levels:  Before meals (preprandial): 80-130 mg/dL (4.4-7.2 mmol/L).  After meals (postprandial): below 180 mg/dL (10 mmol/L).  A1c level: less than 7%. Follow these instructions at home: Questions to [facility] Provider  Consider asking the following questions:  Do I need to meet with a diabetes educator?  Where can I find a support group for people with diabetes?  What equipment will I need to manage my diabetes at home?  What diabetes medicines do I need, and when should I take them?  How often do I need to check my blood glucose?  What number can I call if I have questions?  When is my next appointment? General instructions   Take over-the-counter and prescription medicines only as told by your health care provider.  Keep all follow-up visits as told by your health care provider. This is important.  For more information about diabetes, visit:  American Diabetes Association (ADA): www.diabetes.org  American Association of Diabetes Educators (AADE): www.diabeteseducator.org/patient-resources Contact a health care provider if:  Your blood glucose is at or above 240 mg/dL (13.3 mmol/L) for 2 days in a row.  You have been sick or have had a fever for 2 days or longer and you are not getting better.  You have any of the following problems for more than 6 hours:  You cannot eat or drink.  You have nausea and vomiting.  You have diarrhea. Get help right away if:  Your blood glucose is lower than 54 mg/dL (3.0 mmol/L).  You become confused or you have trouble thinking clearly.  You have difficulty breathing.  You have moderate or large ketone levels in your urine. This information is not intended to replace advice given to you by your health care provider. Make sure you discuss any questions you have with your health care provider. Document Released: 08/09/2005 Document Revised:  01/15/2016 Document Reviewed: 09/12/2015 Elsevier Interactive Patient Education  2017 [employer].

## 2016-12-08 ENCOUNTER — Encounter: Payer: Self-pay | Admitting: Family Medicine

## 2016-12-08 DIAGNOSIS — L80 Vitiligo: Secondary | ICD-10-CM | POA: Diagnosis not present

## 2016-12-09 DIAGNOSIS — Z114 Encounter for screening for human immunodeficiency virus [HIV]: Secondary | ICD-10-CM | POA: Diagnosis not present

## 2016-12-09 DIAGNOSIS — Z794 Long term (current) use of insulin: Secondary | ICD-10-CM | POA: Diagnosis not present

## 2016-12-09 DIAGNOSIS — I10 Essential (primary) hypertension: Secondary | ICD-10-CM | POA: Diagnosis not present

## 2016-12-09 DIAGNOSIS — E1100 Type 2 diabetes mellitus with hyperosmolarity without nonketotic hyperglycemic-hyperosmolar coma (NKHHC): Secondary | ICD-10-CM | POA: Diagnosis not present

## 2016-12-09 DIAGNOSIS — Z1159 Encounter for screening for other viral diseases: Secondary | ICD-10-CM | POA: Diagnosis not present

## 2016-12-10 ENCOUNTER — Other Ambulatory Visit: Payer: Self-pay | Admitting: Family Medicine

## 2016-12-10 ENCOUNTER — Telehealth: Payer: Self-pay

## 2016-12-10 DIAGNOSIS — E119 Type 2 diabetes mellitus without complications: Secondary | ICD-10-CM

## 2016-12-10 DIAGNOSIS — E7800 Pure hypercholesterolemia, unspecified: Secondary | ICD-10-CM

## 2016-12-10 LAB — CBC WITH DIFFERENTIAL/PLATELET
Basophils Absolute: 0 10*3/uL (ref 0.0–0.2)
Basos: 0 %
EOS (ABSOLUTE): 0.1 10*3/uL (ref 0.0–0.4)
EOS: 1 %
HEMATOCRIT: 40.6 % (ref 37.5–51.0)
HEMOGLOBIN: 13.7 g/dL (ref 13.0–17.7)
IMMATURE GRANULOCYTES: 0 %
Immature Grans (Abs): 0 10*3/uL (ref 0.0–0.1)
LYMPHS ABS: 3 10*3/uL (ref 0.7–3.1)
Lymphs: 52 %
MCH: 28.1 pg (ref 26.6–33.0)
MCHC: 33.7 g/dL (ref 31.5–35.7)
MCV: 83 fL (ref 79–97)
MONOCYTES: 6 %
Monocytes Absolute: 0.3 10*3/uL (ref 0.1–0.9)
NEUTROS PCT: 41 %
Neutrophils Absolute: 2.4 10*3/uL (ref 1.4–7.0)
Platelets: 313 10*3/uL (ref 150–379)
RBC: 4.88 x10E6/uL (ref 4.14–5.80)
RDW: 14.9 % (ref 12.3–15.4)
WBC: 5.9 10*3/uL (ref 3.4–10.8)

## 2016-12-10 LAB — COMPREHENSIVE METABOLIC PANEL
ALT: 21 IU/L (ref 0–44)
AST: 22 IU/L (ref 0–40)
Albumin/Globulin Ratio: 1.7 (ref 1.2–2.2)
Albumin: 4.3 g/dL (ref 3.6–4.8)
Alkaline Phosphatase: 42 IU/L (ref 39–117)
BUN/Creatinine Ratio: 17 (ref 10–24)
BUN: 20 mg/dL (ref 8–27)
Bilirubin Total: 0.3 mg/dL (ref 0.0–1.2)
CALCIUM: 9.5 mg/dL (ref 8.6–10.2)
CO2: 22 mmol/L (ref 18–29)
Chloride: 100 mmol/L (ref 96–106)
Creatinine, Ser: 1.15 mg/dL (ref 0.76–1.27)
GFR, EST AFRICAN AMERICAN: 78 mL/min/{1.73_m2} (ref >59)
GFR, EST NON AFRICAN AMERICAN: 68 mL/min/{1.73_m2} (ref >59)
GLOBULIN, TOTAL: 2.5 g/dL (ref 1.5–4.5)
Glucose: 126 mg/dL — ABNORMAL HIGH (ref 65–99)
Potassium: 4.3 mmol/L (ref 3.5–5.2)
Sodium: 139 mmol/L (ref 134–144)
TOTAL PROTEIN: 6.8 g/dL (ref 6.0–8.5)

## 2016-12-10 LAB — TSH: TSH: 1.46 u[IU]/mL (ref 0.450–4.500)

## 2016-12-10 LAB — HEMOGLOBIN A1C
Est. average glucose Bld gHb Est-mCnc: 151 mg/dL
HEMOGLOBIN A1C: 6.9 % — AB (ref 4.8–5.6)

## 2016-12-10 LAB — LIPID PANEL
CHOL/HDL RATIO: 4 ratio (ref 0.0–5.0)
Cholesterol, Total: 190 mg/dL (ref 100–199)
HDL: 48 mg/dL (ref >39)
LDL CALC: 131 mg/dL — AB (ref 0–99)
TRIGLYCERIDES: 54 mg/dL (ref 0–149)
VLDL CHOLESTEROL CAL: 11 mg/dL (ref 5–40)

## 2016-12-10 LAB — HIV ANTIBODY (ROUTINE TESTING W REFLEX): HIV Screen 4th Generation wRfx: NONREACTIVE

## 2016-12-10 LAB — HEPATITIS C ANTIBODY: Hep C Virus Ab: <0.1 s/co ratio (ref 0.0–0.9)

## 2016-12-10 MED ORDER — SIMVASTATIN 20 MG PO TABS: 20.0000 mg | ORAL_TABLET | Freq: Every day | ORAL | 3 refills | Status: DC

## 2016-12-10 NOTE — Telephone Encounter (Signed)
Advised pt of lab results. Pt verbally acknowledges understanding. Med sent in and FU scheduled. Renaldo Fiddler, CMA

## 2016-12-10 NOTE — Telephone Encounter (Signed)
-----   Message from Margo Common, [place] sent at 12/10/2016  9:24 AM EDT ----- Blood sugar and Hgb A1C in good shape. LDL cholesterol high and need Simvastatin 20 mg qd #30 & 3RF. Recheck in 3 months. Will work on trying to schedule referral to Dr. Gabriel Carina as requested.

## 2016-12-15 IMAGING — CT CT ANGIO CHEST
1 of 2 series · 18 of 30 positions shown · IV contrast (APPLIED)
Comparison: None

CLINICAL DATA: Shortness of breath, RIGHT-sided chest pain since
yesterday, cough and congestion for 3 weeks, diabetes mellitus,
hypertension

EXAM:
CT ANGIOGRAPHY CHEST WITH CONTRAST
TECHNIQUE: Multidetector CT imaging of the chest was performed using the
standard protocol during bolus administration of intravenous
contrast. Multiplanar CT image reconstructions and MIPs were
obtained to evaluate the vascular anatomy.
CONTRAST:  75mL OMNIPAQUE IOHEXOL 350 MG/ML SOLN IV

[Series 6: pe 1.0 thins · axial · 0.78mm/px · z∈[+16,+272]mm · 18 of 289 slices shown]
[im 17/289  lung]
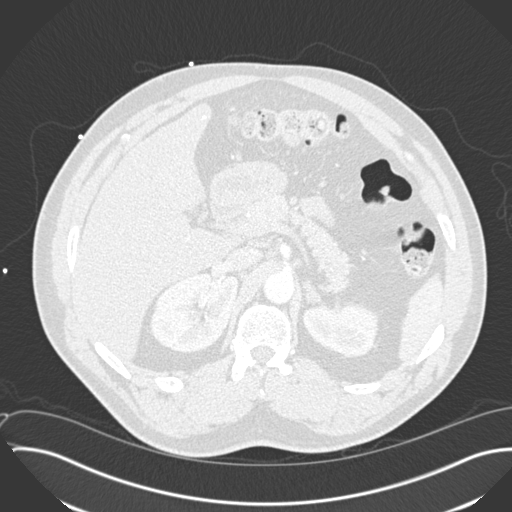
[im 33/289  mediastinal]
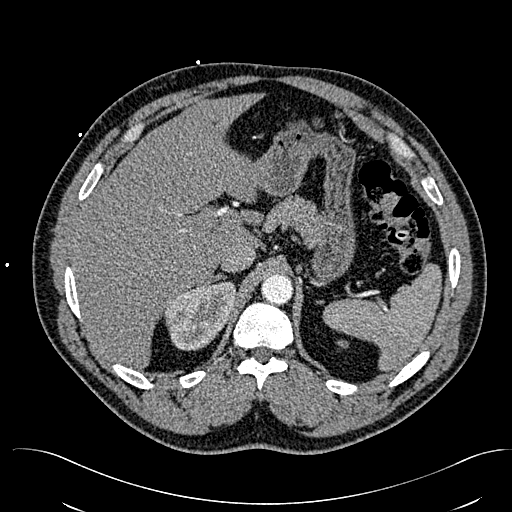
[im 49/289  lung]
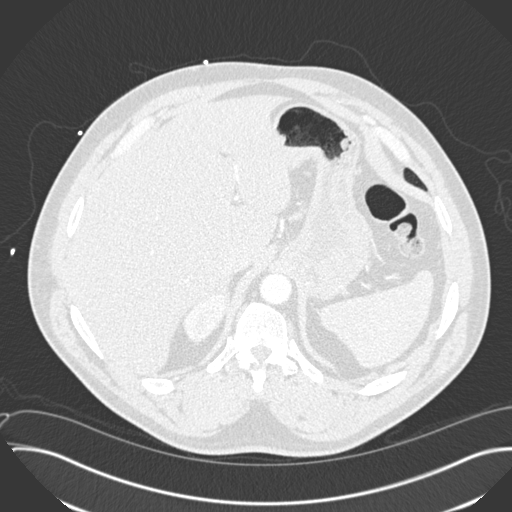
[im 65/289  mediastinal]
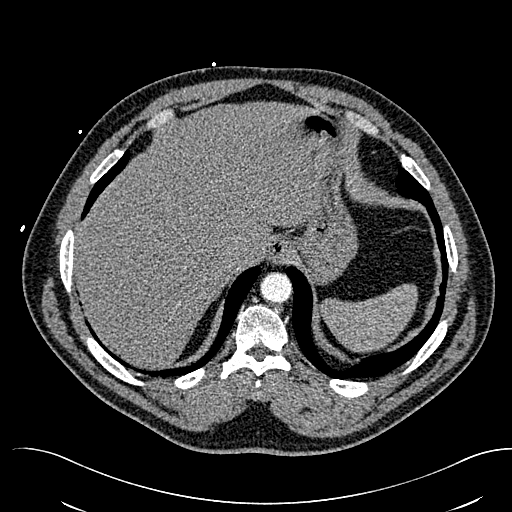
[im 81/289  lung]
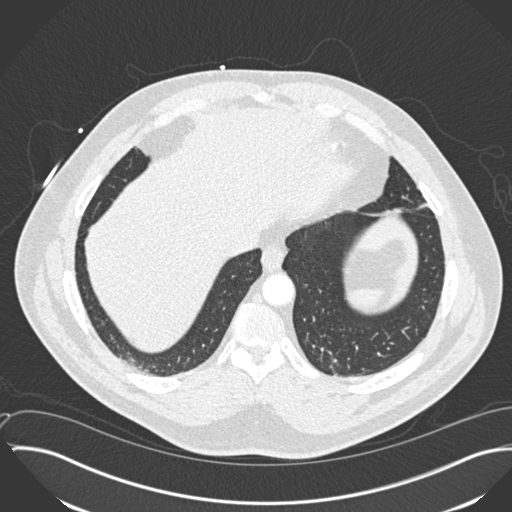
[im 97/289  mediastinal]
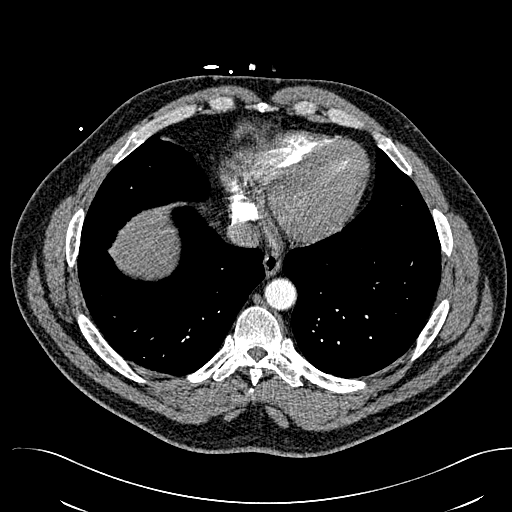
[im 113/289  lung]
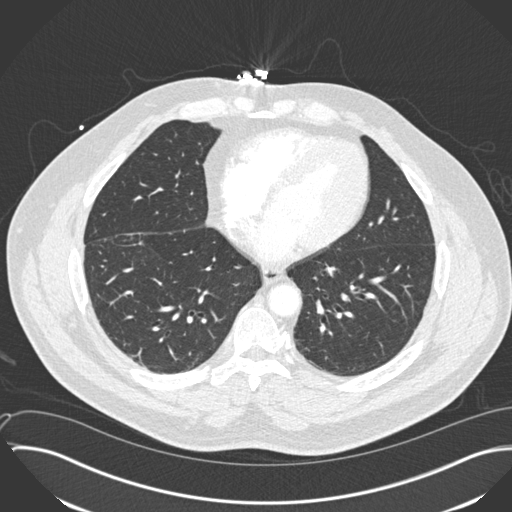
[im 129/289  mediastinal]
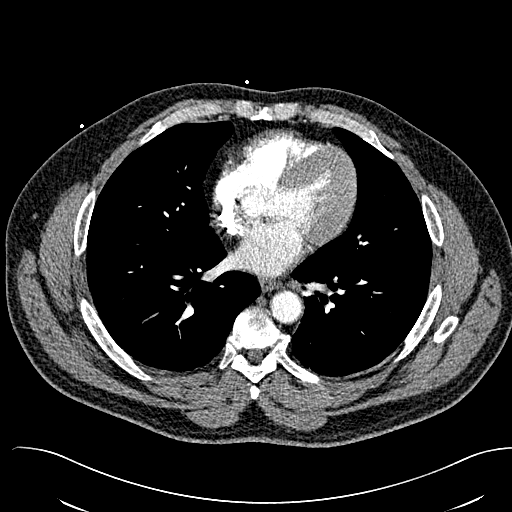
[im 135/289  lung]
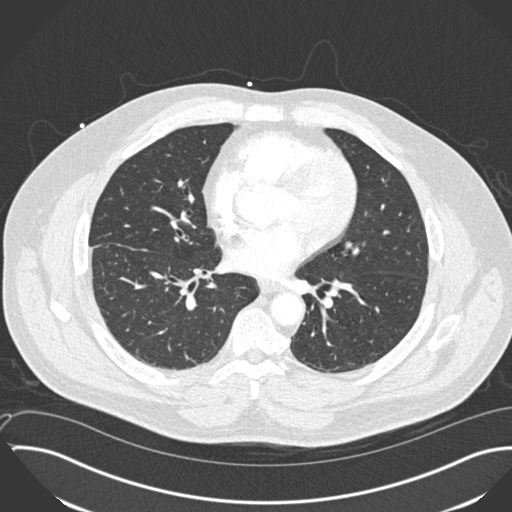
[im 145/289  mediastinal]
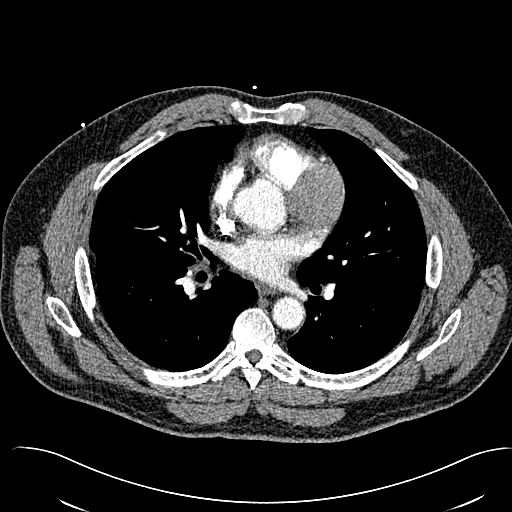
[im 161/289  lung]
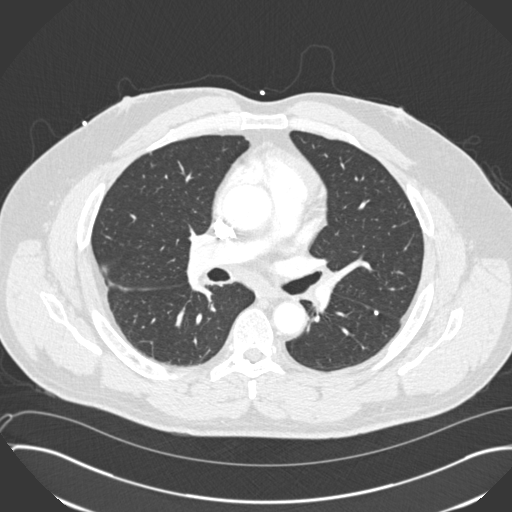
[im 177/289  mediastinal]
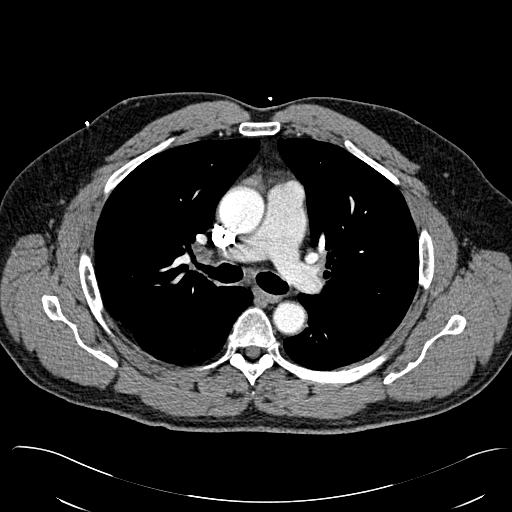
[im 193/289  lung]
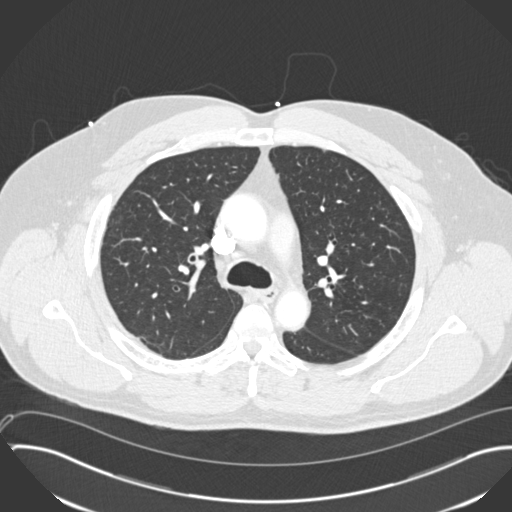
[im 209/289  mediastinal]
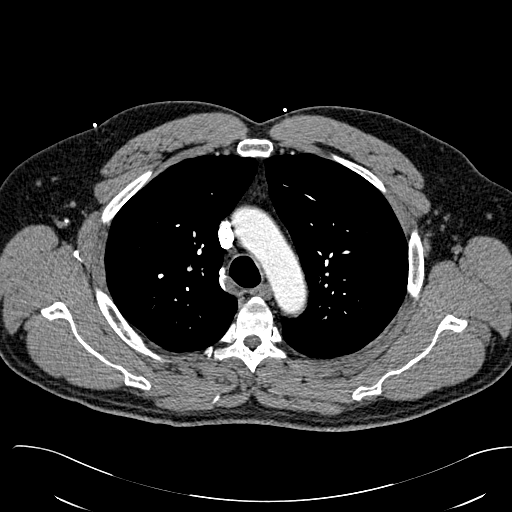
[im 225/289  lung]
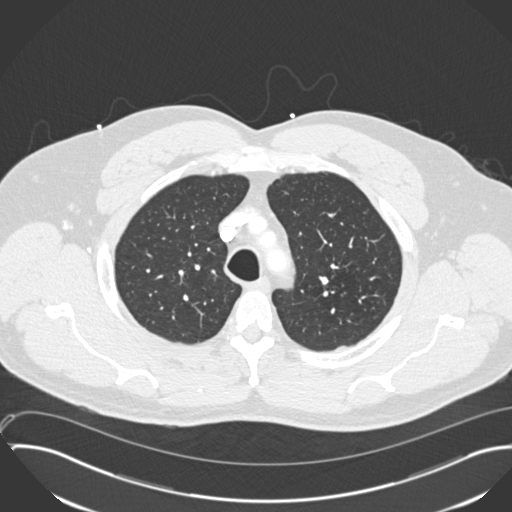
[im 241/289  mediastinal]
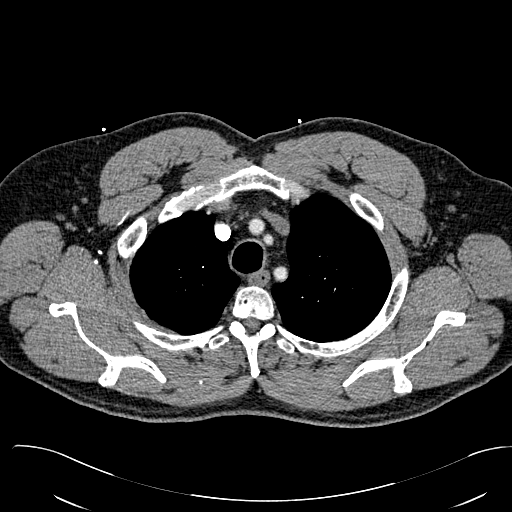
[im 257/289  lung]
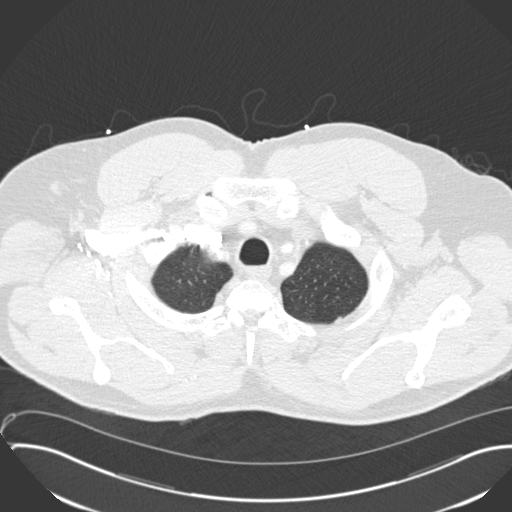
[im 273/289  mediastinal]
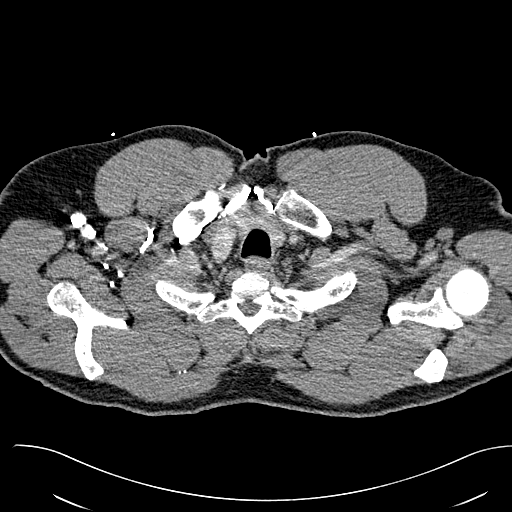

[18 of 30 positions shown; findings below may reference images not displayed]

FINDINGS: Aorta normal caliber without aneurysm or dissection.

Pulmonary arteries suboptimally opacified but remain diagnostic.

Pulmonary arteries patent without evidence of pulmonary embolism.

Small RIGHT hilar lymph nodes without thoracic adenopathy.

Visualized portion of upper abdomen grossly unremarkable for
technique.

Central peribronchial thickening.

Calcified granuloma anterior RIGHT upper lobe and RIGHT base.

Minimal dependent atelectasis in both lower lobes.

No acute infiltrate, pleural effusion or pneumothorax.

Bones unremarkable blurred

Review of the MIP images confirms the above findings.
IMPRESSION: No evidence of pulmonary embolism.

Bronchitic changes with minimal bibasilar atelectasis and old
granulomatous disease.

## 2016-12-15 IMAGING — CR DG CHEST 2V
1 series · 2 of 2 positions shown · non-contrast
Comparison: 12/07/2012

CLINICAL DATA: Cough and right-sided chest pain for 1 day

EXAM:
CHEST - 2 VIEW

[Series 1: w chest pa · 0.14mm/px · 2 of 2 slices shown]
[im 1/2]
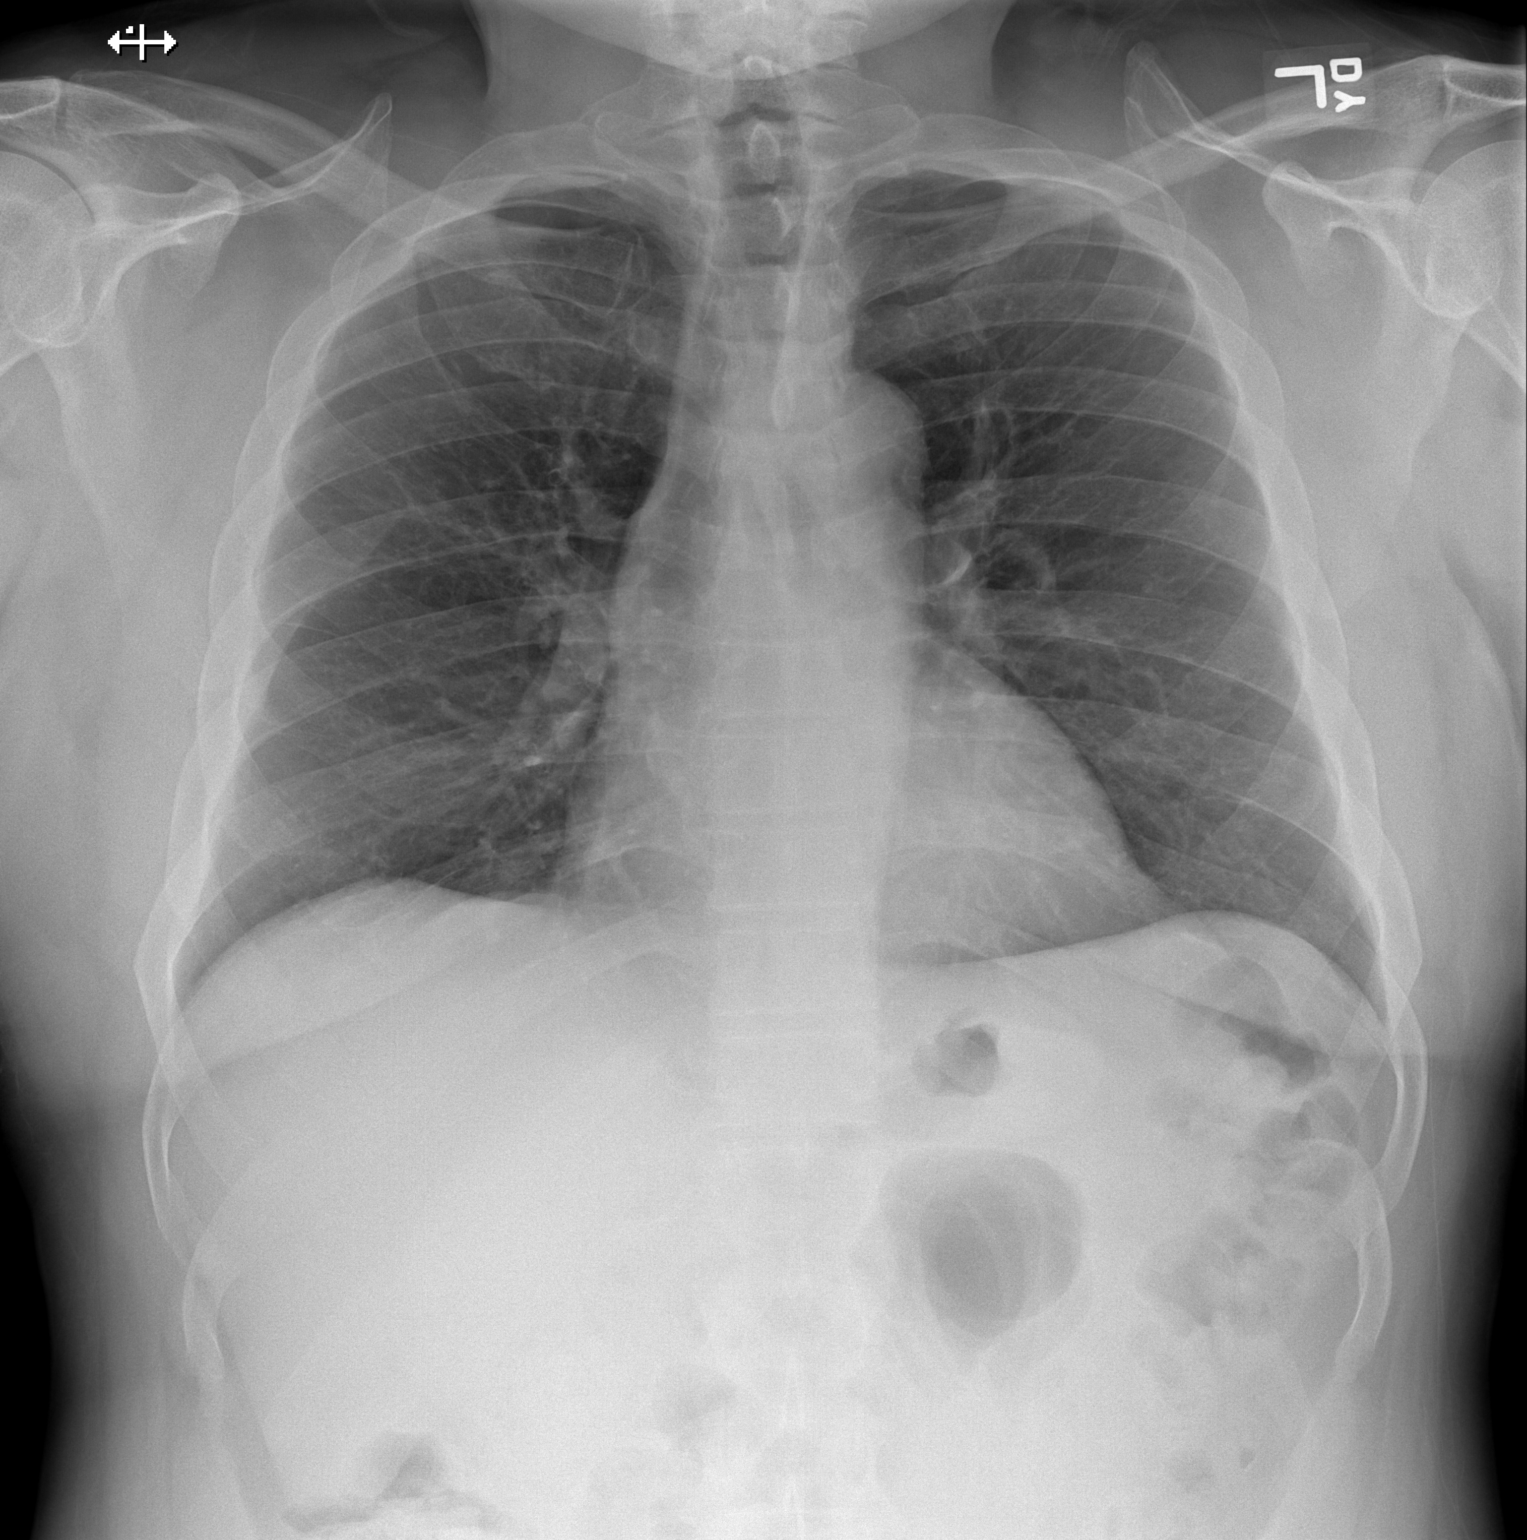
[im 2/2]
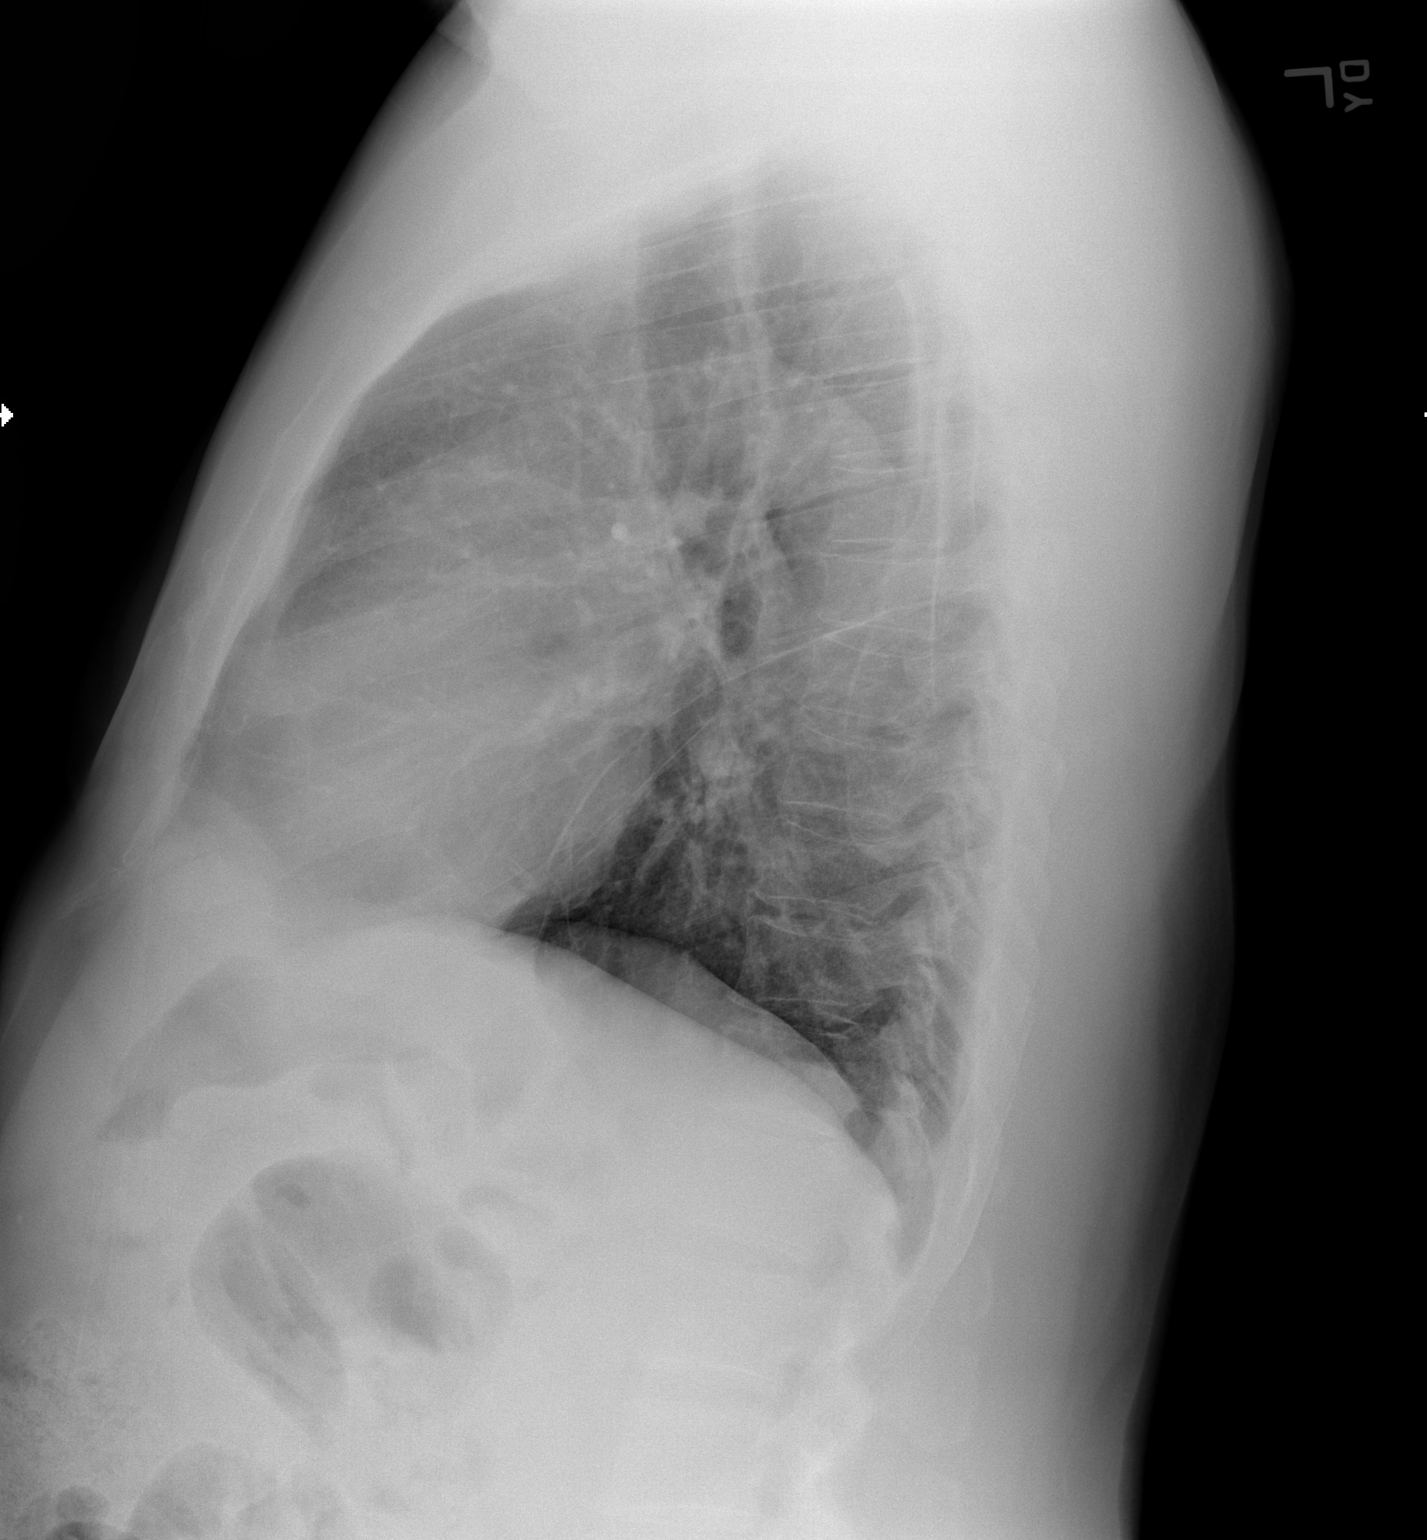

[2 of 2 positions shown; findings below may reference images not displayed]

FINDINGS: The heart size and mediastinal contours are within normal limits.
Both lungs are clear. The visualized skeletal structures are
unremarkable.
IMPRESSION: No active disease.

## 2016-12-30 DIAGNOSIS — Z794 Long term (current) use of insulin: Secondary | ICD-10-CM | POA: Diagnosis not present

## 2016-12-30 DIAGNOSIS — B351 Tinea unguium: Secondary | ICD-10-CM | POA: Diagnosis not present

## 2016-12-30 DIAGNOSIS — E1140 Type 2 diabetes mellitus with diabetic neuropathy, unspecified: Secondary | ICD-10-CM | POA: Diagnosis not present

## 2016-12-30 LAB — HM DIABETES FOOT EXAM

## 2017-01-22 ENCOUNTER — Other Ambulatory Visit: Payer: Self-pay | Admitting: Family Medicine

## 2017-01-22 DIAGNOSIS — J4520 Mild intermittent asthma, uncomplicated: Secondary | ICD-10-CM

## 2017-01-31 DIAGNOSIS — Z794 Long term (current) use of insulin: Secondary | ICD-10-CM | POA: Diagnosis not present

## 2017-01-31 DIAGNOSIS — E1142 Type 2 diabetes mellitus with diabetic polyneuropathy: Secondary | ICD-10-CM | POA: Diagnosis not present

## 2017-01-31 DIAGNOSIS — I10 Essential (primary) hypertension: Secondary | ICD-10-CM | POA: Diagnosis not present

## 2017-03-11 ENCOUNTER — Ambulatory Visit (INDEPENDENT_AMBULATORY_CARE_PROVIDER_SITE_OTHER): Payer: Medicare Other | Admitting: Family Medicine

## 2017-03-11 ENCOUNTER — Encounter: Payer: Self-pay | Admitting: Family Medicine

## 2017-03-11 VITALS — BP 152/96 | HR 84 | Temp 98.2°F | Wt 236.0 lb

## 2017-03-11 DIAGNOSIS — E7800 Pure hypercholesterolemia, unspecified: Secondary | ICD-10-CM | POA: Insufficient documentation

## 2017-03-11 DIAGNOSIS — T148XXA Other injury of unspecified body region, initial encounter: Secondary | ICD-10-CM | POA: Diagnosis not present

## 2017-03-11 DIAGNOSIS — I10 Essential (primary) hypertension: Secondary | ICD-10-CM

## 2017-03-11 DIAGNOSIS — E1142 Type 2 diabetes mellitus with diabetic polyneuropathy: Secondary | ICD-10-CM | POA: Diagnosis not present

## 2017-03-11 MED ORDER — LISINOPRIL 30 MG PO TABS: 30.0000 mg | ORAL_TABLET | Freq: Every day | ORAL | 2 refills | Status: DC

## 2017-03-11 NOTE — Progress Notes (Signed)
Patient: [name] Male    DOB: 01/27/1956   61 y.o.   MRN: [id number] Visit Date: 03/11/2017  Today's Provider: Vernie Murders, PA   Chief Complaint  Patient presents with  . Diabetes  . Hypertension  . Hyperlipidemia  . Follow-up   Subjective:    HPI  Diabetes Mellitus Type II, Follow-up:   Lab Results  Component Value Date   HGBA1C 6.9 (H) 12/09/2016   HGBA1C 12.2 (A) 10/04/2013   HGBA1C SEE COMMENT 11/28/2011   Last seen for diabetes 3 months ago.  Management since then includes continue Lantus, Glipizide, Onglyza, and Metformin. Patient is currently seeing Dr. Gabriel Carina. She discontinued Onglyza and Actos. He reports good compliance with treatment. He is not having side effects.  Current symptoms include none  Home blood sugar records: fasting range: 190-200's  Episodes of hypoglycemia? no   Current Insulin Regimen: Lantus 50U daily and Humalog 15U Most Recent Eye Exam: 08/18/2016 Weight trend: stable Current diet: diabetic Current exercise: walking and yard work  ------------------------------------------------------------------------   Hypertension, follow-up:  BP Readings from Last 3 Encounters:  03/11/17 (!) 152/96  12/06/16 (!) 164/104  03/15/16 (!) 170/80    He was last seen for hypertension 3 months ago.  BP at that visit was 164/104. Management since that visit includes increased Lisinopril to 1 whole tablet daily. Recommended limit salt/sodium intake.He reports good compliance with treatment. He is not having side effects.  He is exercising. He is adherent to low salt diet.   Outside blood pressures are being checked. He is experiencing none.  Patient denies chest pain, chest pressure/discomfort, irregular heart beat and palpitations.   Cardiovascular risk factors include advanced age (older than 47 for men, 72 for women), diabetes mellitus, dyslipidemia, hypertension and male gender.     ------------------------------------------------------------------------    Lipid/Cholesterol, Follow-up:   Last seen for this 3 months ago.  Management since that visit includes started Simvastatin 20 mg on 12/10/16.  Last Lipid Panel:    Component Value Date/Time   CHOL 190 12/09/2016 0829   TRIG 54 12/09/2016 0829   HDL 48 12/09/2016 0829   CHOLHDL 4.0 12/09/2016 0829   LDLCALC 131 (H) 12/09/2016 0829    He reports good compliance with treatment. He is not having side effects.   Wt Readings from Last 3 Encounters:  03/11/17 236 lb (107 kg)  12/06/16 242 lb 9.6 oz (110 kg)  03/15/16 225 lb (102.1 kg)    ------------------------------------------------------------------------ Past Medical History:  Diagnosis Date  . Chest pain, unspecified    atypical  . Diabetes mellitus   . Hypertension   . Nausea & vomiting   . Shortness of breath    Past Surgical History:  Procedure Laterality Date  . NO PAST SURGERIES     Family History  Problem Relation Age of Onset  . Cancer Father   . Diabetes Sister   . Hypertension Sister   . Heart attack Sister   . Seizures Brother   . Coronary artery disease Brother   . Diabetes Brother   . Hypertension Brother    No Known Allergies   Previous Medications   ECONAZOLE NITRATE 1 % CREAM    1 application 2 (two) times daily.   FLUTICASONE (FLONASE) 50 MCG/ACT NASAL SPRAY    USE 2 SPRAYS IN EACH NOSTRIL EVERY NIGHT AT BEDTIME   GLIPIZIDE (GLUCOTROL) 10 MG TABLET    TK 1 T PO BID   GLUCOSE BLOOD (EXPRESS MED TEST STRIP PACK VI)  1 strip daily.   GLUCOSE BLOOD TEST STRIP    1 strip 3 (three) times daily.   HYDROCHLOROTHIAZIDE (HYDRODIURIL) 25 MG TABLET       INSULIN GLARGINE (LANTUS) 100 UNIT/ML SOLOSTAR PEN    Inject 60 Units into the skin 1 day or 1 dose.   INSULIN LISPRO (HUMALOG) 100 UNIT/ML INJECTION    Inject 5 Units into the skin 2 (two) times daily.   LISINOPRIL (PRINIVIL,ZESTRIL) 30 MG TABLET    TAKE 1/2 TABLET BY  MOUTH EVERY DAY   METFORMIN (GLUCOPHAGE) 1000 MG TABLET    Take 1,000 mg by mouth 2 (two) times daily with a meal.   SIMVASTATIN (ZOCOR) 20 MG TABLET    Take 1 tablet (20 mg total) by mouth at bedtime.   VENTOLIN HFA 108 (90 BASE) MCG/ACT INHALER    INHALE 2 PUFFS BY MOUTH EVERY 6 HOURS AS NEEDED FOR WHEEZING OR SHORTNESS OF BREATH    Review of Systems  Constitutional: Negative.   Respiratory: Negative.   Cardiovascular: Negative.   Gastrointestinal: Negative.   Endocrine: Negative.   Musculoskeletal: Positive for arthralgias.    Social History  Substance Use Topics  . Smoking status: Never Smoker  . Smokeless tobacco: Never Used  . Alcohol use No   Objective:   BP (!) 152/96 (BP Location: Right Arm, Patient Position: Sitting, Cuff Size: Normal)   Pulse 84   Temp 98.2 F (36.8 C) (Oral)   Wt 236 lb (107 kg)   SpO2 95%   BMI 32.92 kg/m   Physical Exam  Constitutional: He is oriented to person, place, and time. He appears well-developed and well-nourished. No distress.  HENT:  Head: Normocephalic and atraumatic.  Right Ear: Hearing normal.  Left Ear: Hearing normal.  Nose: Nose normal.  Eyes: Conjunctivae and lids are normal. Right eye exhibits no discharge. Left eye exhibits no discharge. No scleral icterus.  Neck: Neck supple.  Cardiovascular: Normal rate and regular rhythm.   Pulmonary/Chest: Effort normal and breath sounds normal. No respiratory distress.  Abdominal: Soft. Bowel sounds are normal.  Musculoskeletal: Normal range of motion.  Neurological: He is alert and oriented to person, place, and time.  Skin: Skin is intact. Rash noted. No lesion noted.  Spots of total loss of pigment in fingers/hands.  Psychiatric: He has a normal mood and affect. His speech is normal and behavior is normal. Thought content normal.      Assessment & Plan:     1. Diabetic peripheral neuropathy (HCC) Unchanged and followed by Dr. Gabriel Carina (endocrinologist) and Dr. Cleda Mccreedy  (podiatrist) in the next 2 months. No open sores or ulcers.  2. Essential (primary) hypertension Dr. Gabriel Carina added HCTZ 25 mg qd to his Lisinopril 30 mg 1/2 tablet qd on 01-31-17. BP still high and recommend proceeding to one whole tablet of the Lisinopril qd. He will monitor BP at home and follow up with Dr. Gabriel Carina September 2018 for labs and BP recheck. - lisinopril (PRINIVIL,ZESTRIL) 30 MG tablet; Take 1 tablet (30 mg total) by mouth daily.  Dispense: 90 tablet; Refill: 2  3. Hypercholesterolemia Still taking the Simvastatin 20 mg qd and trying to follow low fat restrictions. Total cholesterol 190, HDL 48, triglycerides 54 and LDL 131 on 12-09-16. Continue present dosage, diet and exercise. Recheck levels in 4-6 months.  4. Muscle strain Some nocturnal muscle soreness in both upper arms at night. No specific injury known. 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 Taking some Ibuprofen  and using a "muscle liniment" keeps this from happening. No tenderness to palpation or use of muscles today. No joint pain or swelling. Recheck prn if persistent.

## 2017-03-12 ENCOUNTER — Other Ambulatory Visit: Payer: Self-pay | Admitting: Family Medicine

## 2017-03-12 DIAGNOSIS — M542 Cervicalgia: Secondary | ICD-10-CM

## 2017-04-08 ENCOUNTER — Other Ambulatory Visit: Payer: Self-pay | Admitting: Family Medicine

## 2017-04-08 DIAGNOSIS — J4520 Mild intermittent asthma, uncomplicated: Secondary | ICD-10-CM

## 2017-04-08 NOTE — Telephone Encounter (Signed)
Please review for [employer], PA-aa

## 2017-04-13 ENCOUNTER — Other Ambulatory Visit: Payer: Self-pay | Admitting: Family Medicine

## 2017-04-13 NOTE — Telephone Encounter (Signed)
[facility] patient. Last OV 03/11/17 Last RF 07/05/16

## 2017-04-13 NOTE — Telephone Encounter (Signed)
Pt contacted office for refill request on the following medications:  fluticasone (FLONASE) 50 MCG/ACT nasal spray   [employer].  [phone]

## 2017-05-02 DIAGNOSIS — E1142 Type 2 diabetes mellitus with diabetic polyneuropathy: Secondary | ICD-10-CM | POA: Diagnosis not present

## 2017-05-02 DIAGNOSIS — B351 Tinea unguium: Secondary | ICD-10-CM | POA: Diagnosis not present

## 2017-05-02 DIAGNOSIS — E1140 Type 2 diabetes mellitus with diabetic neuropathy, unspecified: Secondary | ICD-10-CM | POA: Diagnosis not present

## 2017-05-02 DIAGNOSIS — Z794 Long term (current) use of insulin: Secondary | ICD-10-CM | POA: Diagnosis not present

## 2017-05-02 LAB — HEMOGLOBIN A1C: Hemoglobin A1C: 9

## 2017-05-03 DIAGNOSIS — I10 Essential (primary) hypertension: Secondary | ICD-10-CM | POA: Diagnosis not present

## 2017-05-03 DIAGNOSIS — E1142 Type 2 diabetes mellitus with diabetic polyneuropathy: Secondary | ICD-10-CM | POA: Diagnosis not present

## 2017-05-03 DIAGNOSIS — Z794 Long term (current) use of insulin: Secondary | ICD-10-CM | POA: Diagnosis not present

## 2017-05-03 DIAGNOSIS — E1165 Type 2 diabetes mellitus with hyperglycemia: Secondary | ICD-10-CM | POA: Diagnosis not present

## 2017-05-23 ENCOUNTER — Ambulatory Visit (INDEPENDENT_AMBULATORY_CARE_PROVIDER_SITE_OTHER): Payer: Medicare Other | Admitting: Family Medicine

## 2017-05-23 ENCOUNTER — Encounter: Payer: Self-pay | Admitting: Family Medicine

## 2017-05-23 VITALS — BP 124/78 | HR 92 | Temp 98.1°F | Wt 226.0 lb

## 2017-05-23 DIAGNOSIS — Z23 Encounter for immunization: Secondary | ICD-10-CM | POA: Diagnosis not present

## 2017-05-23 DIAGNOSIS — Z1211 Encounter for screening for malignant neoplasm of colon: Secondary | ICD-10-CM | POA: Diagnosis not present

## 2017-05-23 DIAGNOSIS — S46911A Strain of unspecified muscle, fascia and tendon at shoulder and upper arm level, right arm, initial encounter: Secondary | ICD-10-CM

## 2017-05-23 MED ORDER — IBUPROFEN 800 MG PO TABS: ORAL_TABLET | ORAL | 0 refills | Status: DC

## 2017-05-23 NOTE — Progress Notes (Signed)
Patient: [name] Male    DOB: [birth year]   61 y.o.   MRN: [id number] Visit Date: 05/23/2017  Today's Provider: Vernie Murders, PA   Chief Complaint  Patient presents with  . Shoulder Pain   Subjective:    Shoulder Pain   The pain is present in the right shoulder. This is a new problem. Episode onset: 1 month ago. The problem occurs intermittently. The quality of the pain is described as aching. Associated symptoms include a limited range of motion. The symptoms are aggravated by lying down. He has tried cold for the symptoms. His past medical history is significant for diabetes.   Past Medical History:  Diagnosis Date  . Chest pain, unspecified    atypical  . Diabetes mellitus   . Hypertension   . Nausea & vomiting   . Shortness of breath    Past Surgical History:  Procedure Laterality Date  . NO PAST SURGERIES     Family History  Problem Relation Age of Onset  . Cancer Father   . Diabetes Sister   . Hypertension Sister   . Heart attack Sister   . Seizures Brother   . Coronary artery disease Brother   . Diabetes Brother   . Hypertension Brother    No Known Allergies   Previous Medications   ECONAZOLE NITRATE 1 % CREAM    1 application 2 (two) times daily.   FLUTICASONE (FLONASE) 50 MCG/ACT NASAL SPRAY    SHAKE LIQUID AND USE 2 SPRAYS IN EACH NOSTRIL EVERY NIGHT AT BEDTIME   GLIPIZIDE (GLUCOTROL) 10 MG TABLET    TK 1 T PO BID   GLUCOSE BLOOD (EXPRESS MED TEST STRIP PACK VI)    1 strip daily.   GLUCOSE BLOOD TEST STRIP    1 strip 3 (three) times daily.   HYDROCHLOROTHIAZIDE (HYDRODIURIL) 25 MG TABLET       IBUPROFEN (ADVIL,MOTRIN) 800 MG TABLET    TAKE 1 TABLET(800 MG) BY MOUTH THREE TIMES DAILY WITH MEALS   INSULIN GLARGINE (LANTUS) 100 UNIT/ML SOLOSTAR PEN    Inject 60 Units into the skin 1 day or 1 dose.   INSULIN LISPRO (HUMALOG) 100 UNIT/ML INJECTION    Inject 5 Units into the skin 2 (two) times daily.   LISINOPRIL (PRINIVIL,ZESTRIL) 30 MG TABLET    Take  1 tablet (30 mg total) by mouth daily.   METFORMIN (GLUCOPHAGE) 1000 MG TABLET    Take 1,000 mg by mouth 2 (two) times daily with a meal.   SIMVASTATIN (ZOCOR) 20 MG TABLET    Take 1 tablet (20 mg total) by mouth at bedtime.   VENTOLIN HFA 108 (90 BASE) MCG/ACT INHALER    INHALE 2 PUFFS BY MOUTH EVERY 6 HOURS AS NEEDED FOR WHEEZING OR SHORTNESS OF BREATH    Review of Systems  Constitutional: Negative.   Respiratory: Negative.   Cardiovascular: Negative.   Musculoskeletal: Positive for arthralgias.    Social History  Substance Use Topics  . Smoking status: Never Smoker  . Smokeless tobacco: Never Used  . Alcohol use No   Objective:   BP 124/78 (BP Location: Right Arm, Patient Position: Sitting, Cuff Size: Normal)   Pulse 92   Temp 98.1 F (36.7 C) (Oral)   Wt 226 lb (102.5 kg)   SpO2 96%   BMI 31.52 kg/m   Physical Exam  Constitutional: He appears well-developed and well-nourished.  HENT:  Head: Normocephalic.  Nose: Nose normal.  Mouth/Throat: Oropharynx is clear and moist.  Eyes: Conjunctivae are normal.  Neck: Neck supple. No thyromegaly present.  Cardiovascular: Normal rate and regular rhythm.   Pulmonary/Chest: Effort normal and breath sounds normal.  Abdominal: Soft. Bowel sounds are normal.  Musculoskeletal: He exhibits tenderness.  Sharp pain in the right anterior shoulder to reach behind back to tuck his shirt in to his pants. Good grip strength.      Assessment & Plan:     1. Strain of right shoulder, initial encounter Developed pain in the anterior shoulder while pushing a lawnmower 1 month ago. Will treat with Ibuprofen 800 mg TID and limit lifting. Recheck in 10 days if no better. - ibuprofen (ADVIL,MOTRIN) 800 MG tablet; TAKE 1 TABLET(800 MG) BY MOUTH THREE TIMES DAILY WITH MEALS  Dispense: 30 tablet; Refill: 0  2. Colon cancer screening - Ambulatory referral to Gastroenterology  3. Need for influenza vaccination - Flu Vaccine QUAD 6+ mos PF IM  (Fluarix Quad PF)

## 2017-05-23 NOTE — Patient Instructions (Signed)

## 2017-06-25 ENCOUNTER — Other Ambulatory Visit: Payer: Self-pay | Admitting: Family Medicine

## 2017-06-25 DIAGNOSIS — S46911A Strain of unspecified muscle, fascia and tendon at shoulder and upper arm level, right arm, initial encounter: Secondary | ICD-10-CM

## 2017-07-18 ENCOUNTER — Other Ambulatory Visit: Payer: Self-pay | Admitting: Physician Assistant

## 2017-07-27 ENCOUNTER — Telehealth: Payer: Self-pay | Admitting: Gastroenterology

## 2017-07-27 ENCOUNTER — Other Ambulatory Visit: Payer: Self-pay

## 2017-07-27 DIAGNOSIS — Z1211 Encounter for screening for malignant neoplasm of colon: Secondary | ICD-10-CM

## 2017-07-27 NOTE — Telephone Encounter (Signed)
Gastroenterology Pre-Procedure Review  Request Date: Tuesday January 8th 2019 Requesting Physician: Dr. Allen Norris  PATIENT REVIEW QUESTIONS: The patient responded to the following health history questions as indicated:    1. Are you having any GI issues? no 2. Do you have a personal history of Polyps? no 3. Do you have a family history of Colon Cancer or Polyps? no 4. Diabetes Mellitus? yes (Type 2) 5. Joint replacements in the past 12 months?no 6. Major health problems in the past 3 months?no 7. Any artificial heart valves, MVP, or defibrillator?no    MEDICATIONS & ALLERGIES:    Patient reports the following regarding taking any anticoagulation/antiplatelet therapy:   Plavix, Coumadin, Eliquis, Xarelto, Lovenox, Pradaxa, Brilinta, or Effient? no Aspirin? no  Patient confirms/reports the following medications:  Current Outpatient Medications  Medication Sig Dispense Refill  . econazole nitrate 1 % cream 1 application 2 (two) times daily.    . fluticasone (FLONASE) 50 MCG/ACT nasal spray SHAKE LIQUID AND USE 2 SPRAYS IN EACH NOSTRIL EVERY NIGHT AT BEDTIME 16 g 3  . glipiZIDE (GLUCOTROL) 10 MG tablet TK 1 T PO BID  2  . Glucose Blood (EXPRESS MED TEST STRIP PACK VI) 1 strip daily.    Marland Kitchen glucose blood test strip 1 strip 3 (three) times daily.    . hydrochlorothiazide (HYDRODIURIL) 25 MG tablet   11  . ibuprofen (ADVIL,MOTRIN) 800 MG tablet TAKE 1 TABLET(800 MG) BY MOUTH THREE TIMES DAILY WITH MEALS 30 tablet 0  . Insulin Glargine (LANTUS) 100 UNIT/ML Solostar Pen Inject 60 Units into the skin 1 day or 1 dose.    . insulin lispro (HUMALOG) 100 UNIT/ML injection Inject 5 Units into the skin 2 (two) times daily.    Marland Kitchen lisinopril (PRINIVIL,ZESTRIL) 30 MG tablet Take 1 tablet (30 mg total) by mouth daily. 90 tablet 2  . metFORMIN (GLUCOPHAGE) 1000 MG tablet Take 1,000 mg by mouth 2 (two) times daily with a meal.    . simvastatin (ZOCOR) 20 MG tablet Take 1 tablet (20 mg total) by mouth at bedtime.  90 tablet 3  . VENTOLIN HFA 108 (90 Base) MCG/ACT inhaler INHALE 2 PUFFS BY MOUTH EVERY 6 HOURS AS NEEDED FOR WHEEZING OR SHORTNESS OF BREATH 18 g 0   No current facility-administered medications for this visit.     Patient confirms/reports the following allergies:  No Known Allergies  No orders of the defined types were placed in this encounter.   AUTHORIZATION INFORMATION Primary Insurance: 1D#: Group #:  Secondary Insurance: 1D#: Group #:  SCHEDULE INFORMATION: Date: 08/30/17 Time: Location:[facility]

## 2017-07-27 NOTE — Telephone Encounter (Signed)
Patient is returning a call to schedule a colonoscopy. Please call °

## 2017-08-03 DIAGNOSIS — E1140 Type 2 diabetes mellitus with diabetic neuropathy, unspecified: Secondary | ICD-10-CM | POA: Diagnosis not present

## 2017-08-03 DIAGNOSIS — E1165 Type 2 diabetes mellitus with hyperglycemia: Secondary | ICD-10-CM | POA: Diagnosis not present

## 2017-08-03 DIAGNOSIS — B351 Tinea unguium: Secondary | ICD-10-CM | POA: Diagnosis not present

## 2017-08-03 DIAGNOSIS — Z794 Long term (current) use of insulin: Secondary | ICD-10-CM | POA: Diagnosis not present

## 2017-08-03 DIAGNOSIS — L851 Acquired keratosis [keratoderma] palmaris et plantaris: Secondary | ICD-10-CM | POA: Diagnosis not present

## 2017-08-08 DIAGNOSIS — E1165 Type 2 diabetes mellitus with hyperglycemia: Secondary | ICD-10-CM | POA: Diagnosis not present

## 2017-08-08 DIAGNOSIS — E1142 Type 2 diabetes mellitus with diabetic polyneuropathy: Secondary | ICD-10-CM | POA: Diagnosis not present

## 2017-08-08 DIAGNOSIS — I10 Essential (primary) hypertension: Secondary | ICD-10-CM | POA: Diagnosis not present

## 2017-08-08 DIAGNOSIS — Z794 Long term (current) use of insulin: Secondary | ICD-10-CM | POA: Diagnosis not present

## 2017-08-30 ENCOUNTER — Ambulatory Visit: Payer: Medicare Other | Admitting: Certified Registered"

## 2017-08-30 ENCOUNTER — Encounter
Admission: RE | Disposition: A | Discharge status: Home / Self Care | Payer: Self-pay | Source: Ambulatory Visit | Attending: Gastroenterology

## 2017-08-30 ENCOUNTER — Ambulatory Visit
Admission: RE | Admit: 2017-08-30 | Discharge: 2017-08-30 | Disposition: A | Discharge status: Home / Self Care | Payer: Medicare Other | Source: Ambulatory Visit | Attending: Gastroenterology | Admitting: Gastroenterology

## 2017-08-30 ENCOUNTER — Encounter: Payer: Self-pay | Admitting: Emergency Medicine

## 2017-08-30 DIAGNOSIS — I10 Essential (primary) hypertension: Secondary | ICD-10-CM | POA: Diagnosis not present

## 2017-08-30 DIAGNOSIS — K635 Polyp of colon: Secondary | ICD-10-CM

## 2017-08-30 DIAGNOSIS — Z79899 Other long term (current) drug therapy: Secondary | ICD-10-CM | POA: Diagnosis not present

## 2017-08-30 DIAGNOSIS — D125 Benign neoplasm of sigmoid colon: Secondary | ICD-10-CM

## 2017-08-30 DIAGNOSIS — E119 Type 2 diabetes mellitus without complications: Secondary | ICD-10-CM | POA: Insufficient documentation

## 2017-08-30 DIAGNOSIS — Z1211 Encounter for screening for malignant neoplasm of colon: Secondary | ICD-10-CM

## 2017-08-30 DIAGNOSIS — D123 Benign neoplasm of transverse colon: Secondary | ICD-10-CM | POA: Diagnosis not present

## 2017-08-30 DIAGNOSIS — K6389 Other specified diseases of intestine: Secondary | ICD-10-CM

## 2017-08-30 DIAGNOSIS — Z794 Long term (current) use of insulin: Secondary | ICD-10-CM | POA: Diagnosis not present

## 2017-08-30 DIAGNOSIS — D126 Benign neoplasm of colon, unspecified: Secondary | ICD-10-CM | POA: Diagnosis not present

## 2017-08-30 LAB — GLUCOSE, CAPILLARY: Glucose-Capillary: 166 mg/dL — ABNORMAL HIGH (ref 65–99)

## 2017-08-30 SURGERY — COLONOSCOPY WITH PROPOFOL
Anesthesia: General

## 2017-08-30 MED ORDER — SODIUM CHLORIDE 0.9 % IV SOLN
INTRAVENOUS | Status: DC | PRN
  Administered 2017-08-30: 10:00:00 via INTRAVENOUS

## 2017-08-30 MED ORDER — PROPOFOL 500 MG/50ML IV EMUL
INTRAVENOUS | Status: AC
  Filled 2017-08-30: qty 50

## 2017-08-30 MED ORDER — LIDOCAINE HCL (CARDIAC) 20 MG/ML IV SOLN
INTRAVENOUS | Status: DC | PRN
  Administered 2017-08-30: 100 mg via INTRAVENOUS

## 2017-08-30 MED ORDER — PROPOFOL 500 MG/50ML IV EMUL
INTRAVENOUS | Status: DC | PRN
  Administered 2017-08-30: 150 ug/kg/min via INTRAVENOUS

## 2017-08-30 MED ORDER — PHENYLEPHRINE HCL 10 MG/ML IJ SOLN
INTRAMUSCULAR | Status: AC
  Filled 2017-08-30: qty 1

## 2017-08-30 MED ORDER — LIDOCAINE HCL (PF) 2 % IJ SOLN
INTRAMUSCULAR | Status: AC
  Filled 2017-08-30: qty 10

## 2017-08-30 MED ORDER — PHENYLEPHRINE HCL 10 MG/ML IJ SOLN
INTRAMUSCULAR | Status: DC | PRN
  Administered 2017-08-30 (×3): 200 ug via INTRAVENOUS

## 2017-08-30 MED ORDER — SODIUM CHLORIDE 0.9 % IV SOLN
INTRAVENOUS | Status: DC
  Administered 2017-08-30: 1000 mL via INTRAVENOUS

## 2017-08-30 MED ORDER — PROPOFOL 10 MG/ML IV BOLUS
INTRAVENOUS | Status: AC
  Filled 2017-08-30: qty 20

## 2017-08-30 MED ORDER — PROPOFOL 10 MG/ML IV BOLUS
INTRAVENOUS | Status: DC | PRN
  Administered 2017-08-30: 50 mg via INTRAVENOUS

## 2017-08-30 NOTE — Transfer of Care (Signed)
Immediate Anesthesia Transfer of Care Note  Patient: [name]  Procedure(s) Performed: COLONOSCOPY WITH PROPOFOL ([name] )  Patient Location: PACU and Endoscopy Unit  Anesthesia Type:General  Level of Consciousness: drowsy and patient cooperative  Airway & Oxygen Therapy: Patient Spontanous Breathing and Patient connected to nasal cannula oxygen  Post-op Assessment: Report given to RN and Post -op Vital signs reviewed and stable  Post vital signs: Reviewed and stable  Last Vitals:  Vitals:   08/30/17 0914  BP: 132/87  Pulse: 97  Resp: 17  Temp: 36.7 C  SpO2: 98%    Last Pain:  Vitals:   08/30/17 0914  TempSrc: Oral         Complications: No apparent anesthesia complications

## 2017-08-30 NOTE — Anesthesia Preprocedure Evaluation (Signed)
Anesthesia Evaluation  Patient identified by MRN, date of birth, ID band Patient awake    Reviewed: Allergy & Precautions, NPO status , Patient's Chart, lab work & pertinent test results  History of Anesthesia Complications Negative for: history of anesthetic complications  Airway Mallampati: II       Dental  (+) Missing, Chipped   Pulmonary neg sleep apnea, neg COPD,           Cardiovascular hypertension, Pt. on medications (-) Past MI and (-) CHF (-) dysrhythmias (-) Valvular Problems/Murmurs     Neuro/Psych neg Seizures Depression    GI/Hepatic Neg liver ROS, neg GERD  ,  Endo/Other  diabetes, Type 2, Oral Hypoglycemic Agents, Insulin Dependent  Renal/GU negative Renal ROS     Musculoskeletal   Abdominal   Peds  Hematology   Anesthesia Other Findings   Reproductive/Obstetrics                             Anesthesia Physical Anesthesia Plan  ASA: III  Anesthesia Plan: General   Post-op Pain Management:    Induction: Intravenous  PONV Risk Score and Plan: 2 and TIVA and Propofol infusion  Airway Management Planned:   Additional Equipment:   Intra-op Plan:   Post-operative Plan:   Informed Consent: I have reviewed the patients History and Physical, chart, labs and discussed the procedure including the risks, benefits and alternatives for the proposed anesthesia with the patient or authorized representative who has indicated his/her understanding and acceptance.     Plan Discussed with:   Anesthesia Plan Comments:         Anesthesia Quick Evaluation

## 2017-08-30 NOTE — Op Note (Signed)
[facility] Gastroenterology Patient Name: [name] Procedure Date: 08/30/2017 9:35 AM MRN: [id number] Account #: [id number] Date of Birth: 12-21-1955 Admit Type: Outpatient Age: 62 Room: [facility] ENDO ROOM 4 Gender: Male Note Status: Finalized Procedure:            Colonoscopy Indications:          Screening for colorectal malignant neoplasm Providers:            Teegan Guinther B. Bonna Gains MD, MD Referring MD:         Vickki Muff. Chrismon, MD (Referring MD) Medicines:            Monitored Anesthesia Care Complications:        No immediate complications. Procedure:            Pre-Anesthesia Assessment:                       - ASA Grade Assessment: III - A patient with severe                        systemic disease.                       - Prior to the procedure, a History and Physical was                        performed, and patient medications, allergies and                        sensitivities were reviewed. The patient's tolerance of                        previous anesthesia was reviewed.                       - The risks and benefits of the procedure and the                        sedation options and risks were discussed with the                        patient. All questions were answered and informed                        consent was obtained.                       - Patient identification and proposed procedure were                        verified prior to the procedure by the physician, the                        nurse, the anesthesiologist, the anesthetist and the                        technician. The procedure was verified in the procedure                        room.  After obtaining informed consent, the colonoscope was                        passed under direct vision. Throughout the procedure,                        the patient's blood pressure, pulse, and oxygen                        saturations were monitored continuously. The                       Colonoscope was introduced through the anus and                        advanced to the the cecum, identified by appendiceal                        orifice and ileocecal valve. The colonoscopy was                        performed with ease. The patient tolerated the                        procedure well. The quality of the bowel preparation                        was good. Findings:      The perianal and digital rectal examinations were normal.      Six sessile polyps were found in the sigmoid colon and ascending colon.       The polyps were 3 to 5 mm in size. These polyps were removed with a cold       biopsy forceps. Resection and retrieval were complete.      A localized area of thickened folds of the mucosa was found in the       ascending colon. Biopsies were taken with a cold forceps for histology.      The exam was otherwise without abnormality.      The rectum, sigmoid colon, descending colon, transverse colon, ascending       colon and cecum appeared normal.      The retroflexed view of the distal rectum and anal verge was normal and       showed no anal or rectal abnormalities. Impression:           - Six 3 to 5 mm polyps in the sigmoid colon and in the                        ascending colon, removed with a cold biopsy forceps.                        Resected and retrieved.                       - Thickened folds of the mucosa in the ascending colon.                        Biopsied.                       - The  examination was otherwise normal.                       - The rectum, sigmoid colon, descending colon,                        transverse colon, ascending colon and cecum are normal.                       - The distal rectum and anal verge are normal on                        retroflexion view. Recommendation:       - Discharge patient to home (with escort).                       - Advance diet as tolerated.                       - Continue present  medications.                       - Await pathology results.                       - Repeat colonoscopy date to be determined after                        pending pathology results are reviewed for surveillance.                       - The findings and recommendations were discussed with                        the patient.                       - The findings and recommendations were discussed with                        the patient's family.                       - Return to primary care physician as previously                        scheduled. Procedure Code(s):    --- Professional ---                       [phone], Colonoscopy, flexible; with biopsy, single or                        multiple Diagnosis Code(s):    --- Professional ---                       Z12.11, Encounter for screening for malignant neoplasm                        of colon                       D12.5, Benign neoplasm of sigmoid colon  D12.2, Benign neoplasm of ascending colon                       K63.89, Other specified diseases of intestine CPT copyright 2016 American Medical Association. All rights reserved. The codes documented in this report are preliminary and upon coder review may  be revised to meet current compliance requirements.  [name], MD Margretta Sidle B. Bonna Gains MD, MD 08/30/2017 10:41:38 AM This report has been signed electronically. Number of Addenda: 0 Note Initiated On: 08/30/2017 9:35 AM Scope Withdrawal Time: 0 hours 37 minutes 51 seconds  Total Procedure Duration: 0 hours 46 minutes 21 seconds  Estimated Blood Loss: Estimated blood loss: none.      [facility]

## 2017-08-30 NOTE — H&P (Signed)
George Antigua, MD [address], [place], [place], [place], [address] [address], [place], [place], [place], [address] Phone: [phone]  Fax: [phone]  Primary Care Physician:  Margo Common, PA   Pre-Procedure History & Physical: HPI:  [name] is a 62 y.o. male is here for an colonoscopy.   Past Medical History:  Diagnosis Date  . Chest pain, unspecified    atypical  . Diabetes mellitus   . Hypertension   . Nausea & vomiting   . Shortness of breath     Past Surgical History:  Procedure Laterality Date  . NO PAST SURGERIES      Prior to Admission medications   Medication Sig Start Date End Date Taking? Authorizing Provider  econazole nitrate 1 % cream 1 application 2 (two) times daily. 08/26/14   [provider]  fluticasone (FLONASE) 50 MCG/ACT nasal spray SHAKE LIQUID AND USE 2 SPRAYS IN EACH NOSTRIL EVERY NIGHT AT BEDTIME 07/18/17   Chrismon, Simona Huh E, PA  glipiZIDE (GLUCOTROL) 10 MG tablet TK 1 T PO BID 06/25/15   [provider]  Glucose Blood (EXPRESS MED TEST STRIP PACK VI) 1 strip daily. 10/17/09   [provider]  glucose blood test strip 1 strip 3 (three) times daily. 01/24/14   [provider]  hydrochlorothiazide (HYDRODIURIL) 25 MG tablet  01/31/17   [provider]  ibuprofen (ADVIL,MOTRIN) 800 MG tablet TAKE 1 TABLET(800 MG) BY MOUTH THREE TIMES DAILY WITH MEALS 06/27/17   Chrismon, Vickki Muff, PA  Insulin Glargine (LANTUS) 100 UNIT/ML Solostar Pen Inject 60 Units into the skin 1 day or 1 dose. 02/11/14   [provider]  insulin lispro (HUMALOG) 100 UNIT/ML injection Inject 5 Units into the skin 2 (two) times daily.    [provider]  lisinopril (PRINIVIL,ZESTRIL) 30 MG tablet Take 1 tablet (30 mg total) by mouth daily. 03/11/17   Chrismon, Vickki Muff, PA  metFORMIN (GLUCOPHAGE) 1000 MG tablet Take 1,000 mg by mouth 2 (two) times daily with a meal.    [provider]    simvastatin (ZOCOR) 20 MG tablet Take 1 tablet (20 mg total) by mouth at bedtime. 12/10/16   Chrismon, Vickki Muff, PA  VENTOLIN HFA 108 (90 Base) MCG/ACT inhaler INHALE 2 PUFFS BY MOUTH EVERY 6 HOURS AS NEEDED FOR WHEEZING OR SHORTNESS OF BREATH 04/08/17   Virginia Crews, MD    Allergies as of 07/27/2017  . (No Known Allergies)    Family History  Problem Relation Age of Onset  . Cancer Father   . Diabetes Sister   . Hypertension Sister   . Heart attack Sister   . Seizures Brother   . Coronary artery disease Brother   . Diabetes Brother   . Hypertension Brother     Social History   Socioeconomic History  . Marital status: Married    Spouse name: Not on file  . Number of children: Not on file  . Years of education: Not on file  . Highest education level: Not on file  Social Needs  . Financial resource strain: Not on file  . Food insecurity - worry: Not on file  . Food insecurity - inability: Not on file  . Transportation needs - medical: Not on file  . Transportation needs - non-medical: Not on file  Occupational History  . Not on file  Tobacco Use  . Smoking status: Never Smoker  . Smokeless tobacco: Never Used  Substance and Sexual Activity  .  Alcohol use: No  . Drug use: No  . Sexual activity: Not on file  Other Topics Concern  . Not on file  Social History Narrative  . Not on file    Review of Systems: See HPI, otherwise negative ROS  Physical Exam: There were no vitals taken for this visit. General:   Alert,  pleasant and cooperative in NAD Head:  Normocephalic and atraumatic. Neck:  Supple; no masses or thyromegaly. Lungs:  Clear throughout to auscultation, normal respiratory effort.    Heart:  +S1, +S2, Regular rate and rhythm, No edema. Abdomen:  Soft, nontender and nondistended. Normal bowel sounds, without guarding, and without rebound.   Neurologic:  Alert and  oriented x4;  grossly normal neurologically.  Impression/Plan: [name] is  here for an colonoscopy to be performed for screeening due to prior history of colon polyps   Risks, benefits, limitations, and alternatives regarding  colonoscopy have been reviewed with the patient.  Questions have been answered.  All parties agreeable.   Virgel Manifold, MD  08/30/2017, 8:37 AM

## 2017-08-30 NOTE — Anesthesia Post-op Follow-up Note (Signed)
Anesthesia QCDR form completed.        

## 2017-08-30 NOTE — Anesthesia Postprocedure Evaluation (Signed)
Anesthesia Post Note  Patient: [name]  Procedure(s) Performed: COLONOSCOPY WITH PROPOFOL ([name] )  Patient location during evaluation: Endoscopy Anesthesia Type: General Level of consciousness: awake and alert Pain management: pain level controlled Vital Signs Assessment: post-procedure vital signs reviewed and stable Respiratory status: spontaneous breathing and respiratory function stable Cardiovascular status: stable Anesthetic complications: no     Last Vitals:  Vitals:   08/30/17 0914 08/30/17 1040  BP: 132/87 118/69  Pulse: 97 96  Resp: 17 18  Temp: 36.7 C (!) 36 C  SpO2: 98% 98%    Last Pain:  Vitals:   08/30/17 1040  TempSrc: Tympanic                 [name]

## 2017-09-01 ENCOUNTER — Encounter: Payer: Self-pay | Admitting: Gastroenterology

## 2017-09-01 LAB — SURGICAL PATHOLOGY

## 2017-09-07 NOTE — Progress Notes (Signed)
Letter mailed to pt.  

## 2017-09-10 ENCOUNTER — Other Ambulatory Visit: Payer: Self-pay | Admitting: Family Medicine

## 2017-09-10 DIAGNOSIS — S46911A Strain of unspecified muscle, fascia and tendon at shoulder and upper arm level, right arm, initial encounter: Secondary | ICD-10-CM

## 2017-11-02 DIAGNOSIS — E1165 Type 2 diabetes mellitus with hyperglycemia: Secondary | ICD-10-CM | POA: Diagnosis not present

## 2017-11-02 DIAGNOSIS — E1142 Type 2 diabetes mellitus with diabetic polyneuropathy: Secondary | ICD-10-CM | POA: Diagnosis not present

## 2017-11-02 DIAGNOSIS — Z794 Long term (current) use of insulin: Secondary | ICD-10-CM | POA: Diagnosis not present

## 2017-11-02 DIAGNOSIS — I10 Essential (primary) hypertension: Secondary | ICD-10-CM | POA: Diagnosis not present

## 2017-11-07 DIAGNOSIS — Z794 Long term (current) use of insulin: Secondary | ICD-10-CM | POA: Diagnosis not present

## 2017-11-07 DIAGNOSIS — B351 Tinea unguium: Secondary | ICD-10-CM | POA: Diagnosis not present

## 2017-11-07 DIAGNOSIS — L851 Acquired keratosis [keratoderma] palmaris et plantaris: Secondary | ICD-10-CM | POA: Diagnosis not present

## 2017-11-07 DIAGNOSIS — E1140 Type 2 diabetes mellitus with diabetic neuropathy, unspecified: Secondary | ICD-10-CM | POA: Diagnosis not present

## 2017-11-09 DIAGNOSIS — E1142 Type 2 diabetes mellitus with diabetic polyneuropathy: Secondary | ICD-10-CM | POA: Diagnosis not present

## 2017-11-09 DIAGNOSIS — E1165 Type 2 diabetes mellitus with hyperglycemia: Secondary | ICD-10-CM | POA: Diagnosis not present

## 2017-11-09 DIAGNOSIS — I10 Essential (primary) hypertension: Secondary | ICD-10-CM | POA: Diagnosis not present

## 2017-11-09 DIAGNOSIS — Z794 Long term (current) use of insulin: Secondary | ICD-10-CM | POA: Diagnosis not present

## 2017-12-19 ENCOUNTER — Encounter: Payer: Self-pay | Admitting: Emergency Medicine

## 2017-12-19 ENCOUNTER — Emergency Department
Admission: EM | Admit: 2017-12-19 | Discharge: 2017-12-19 | Disposition: A | Discharge status: Home / Self Care | Payer: Medicare Other | Attending: Emergency Medicine | Admitting: Emergency Medicine

## 2017-12-19 ENCOUNTER — Other Ambulatory Visit: Payer: Self-pay

## 2017-12-19 DIAGNOSIS — M2669 Other specified disorders of temporomandibular joint: Secondary | ICD-10-CM | POA: Insufficient documentation

## 2017-12-19 DIAGNOSIS — I10 Essential (primary) hypertension: Secondary | ICD-10-CM | POA: Diagnosis not present

## 2017-12-19 DIAGNOSIS — E119 Type 2 diabetes mellitus without complications: Secondary | ICD-10-CM | POA: Insufficient documentation

## 2017-12-19 DIAGNOSIS — R6884 Jaw pain: Secondary | ICD-10-CM | POA: Diagnosis present

## 2017-12-19 DIAGNOSIS — Z794 Long term (current) use of insulin: Secondary | ICD-10-CM | POA: Insufficient documentation

## 2017-12-19 DIAGNOSIS — M26601 Right temporomandibular joint disorder, unspecified: Secondary | ICD-10-CM | POA: Diagnosis not present

## 2017-12-19 MED ORDER — PREDNISONE 50 MG PO TABS: 50.0000 mg | ORAL_TABLET | Freq: Every day | ORAL | 0 refills | Status: DC

## 2017-12-19 NOTE — ED Provider Notes (Signed)
[facility] Emergency Department Provider Note  ____________________________________________  Time seen: Approximately 10:15 PM  I have reviewed the triage vital signs and the nursing notes.   HISTORY  Chief Complaint Jaw Pain and Dental Pain    HPI [name] is a 62 y.o. male who presents emergency department complaining of pain to the right "jaw joint" as well as swelling.  Patient reports that this morning he woke up, visualized himself in the mirror and reported swelling around the TMJ region.  Patient reports that this has improved throughout the day but he has a pain as well as a clicking sensation when eating.  Patient reports no pain at baseline.  He denies any fevers or chills, nasal congestion, sore throat, cough.  Patient denies any ear pain or hearing changes.  No visual changes.  Patient does not take any medication for this complaint prior to arrival.  Patient reports that he does not have any dentition in this region.  No oropharyngeal trauma.  No other complaints at this time.  Past Medical History:  Diagnosis Date  . Chest pain, unspecified    atypical  . Diabetes mellitus   . Hypertension   . Nausea & vomiting   . Shortness of breath     Patient Active Problem List   Diagnosis Date Noted  . Special screening for malignant neoplasms, colon   . Polyp of sigmoid colon   . Intestinal lump   . Hypercholesterolemia 03/11/2017  . Peripheral neuropathy 03/15/2016  . Diabetic peripheral neuropathy ([place]) 03/15/2016  . Vitiligo 03/15/2016  . Nocturia 03/15/2016  . Blood in stool 03/15/2016  . Abdominal discomfort 01/07/2015  . Acute low back pain 01/07/2015  . Bronchitis 01/07/2015  . Confusion state 01/07/2015  . CN (constipation) 01/07/2015  . Cough, persistent 01/07/2015  . Dysthymic disorder 01/07/2015  . Breathlessness on exertion 01/07/2015  . Abdominal discomfort, epigastric 01/07/2015  . Essential (primary) hypertension  01/07/2015  . Malaise and fatigue 01/07/2015  . Suicidal ideation 01/07/2015  . Tension type headache 01/07/2015  . Pain of thigh 01/07/2015  . Chest pain 10/28/2011  . Diabetes mellitus ([place]) 10/28/2011  . HTN (hypertension) 10/28/2011  . D (diarrhea) 10/17/2009  . Onychia of toe 01/31/2009  . Dermatophytic onychia 01/31/2009  . Diabetes mellitus type 2, uncontrolled ([place]) 06/02/2006    Past Surgical History:  Procedure Laterality Date  . NO PAST SURGERIES      Prior to Admission medications   Medication Sig Start Date End Date Taking? Authorizing Provider  econazole nitrate 1 % cream 1 application 2 (two) times daily. 08/26/14   [provider]  fluticasone (FLONASE) 50 MCG/ACT nasal spray SHAKE LIQUID AND USE 2 SPRAYS IN EACH NOSTRIL EVERY NIGHT AT BEDTIME 07/18/17   Chrismon, Simona Huh E, PA  glipiZIDE (GLUCOTROL) 10 MG tablet TK 1 T PO BID 06/25/15   [provider]  Glucose Blood (EXPRESS MED TEST STRIP PACK VI) 1 strip daily. 10/17/09   [provider]  glucose blood test strip 1 strip 3 (three) times daily. 01/24/14   [provider]  hydrochlorothiazide (HYDRODIURIL) 25 MG tablet  01/31/17   [provider]  ibuprofen (ADVIL,MOTRIN) 800 MG tablet TAKE 1 TABLET(800 MG) BY MOUTH THREE TIMES DAILY WITH MEALS 09/12/17   Chrismon, Vickki Muff, PA  Insulin Glargine (LANTUS) 100 UNIT/ML Solostar Pen Inject 60 Units into the skin 1 day or 1 dose. 02/11/14   [provider]  insulin lispro (HUMALOG) 100 UNIT/ML injection  Inject 5 Units into the skin 2 (two) times daily.    [provider]  lisinopril (PRINIVIL,ZESTRIL) 30 MG tablet Take 1 tablet (30 mg total) by mouth daily. 03/11/17   Chrismon, Vickki Muff, PA  metFORMIN (GLUCOPHAGE) 1000 MG tablet Take 1,000 mg by mouth 2 (two) times daily with a meal.    [provider]  predniSONE (DELTASONE) 50 MG tablet Take 1 tablet (50 mg total) by mouth daily with breakfast. 12/19/17    Damonie Ellenwood, Charline Bills, PA-C  simvastatin (ZOCOR) 20 MG tablet Take 1 tablet (20 mg total) by mouth at bedtime. 12/10/16   Chrismon, Dennis E, PA  VENTOLIN HFA 108 (90 Base) MCG/ACT inhaler INHALE 2 PUFFS BY MOUTH EVERY 6 HOURS AS NEEDED FOR WHEEZING OR SHORTNESS OF BREATH 04/08/17   Bacigalupo, Dionne Bucy, MD    Allergies Patient has no known allergies.  Family History  Problem Relation Age of Onset  . Cancer Father   . Diabetes Sister   . Hypertension Sister   . Heart attack Sister   . Seizures Brother   . Coronary artery disease Brother   . Diabetes Brother   . Hypertension Brother     Social History Social History   Tobacco Use  . Smoking status: Never Smoker  . Smokeless tobacco: Never Used  Substance Use Topics  . Alcohol use: No  . Drug use: No     Review of Systems  Constitutional: No fever/chills Eyes: No visual changes. No discharge ENT: No upper respiratory complaints.  Pain, swelling to the TMJ region right side. Cardiovascular: no chest pain. Respiratory: no cough. No SOB. Gastrointestinal: No abdominal pain.  No nausea, no vomiting.  No diarrhea.  No constipation. Musculoskeletal: Negative for musculoskeletal pain. Skin: Negative for rash, abrasions, lacerations, ecchymosis. Neurological: Negative for headaches, focal weakness or numbness. 10-point ROS otherwise negative.  ____________________________________________   PHYSICAL EXAM:  VITAL SIGNS: ED Triage Vitals  Enc Vitals Group     BP 12/19/17 2032 (!) 152/87     Pulse Rate 12/19/17 2032 (!) 101     Resp 12/19/17 2032 18     Temp 12/19/17 2032 98.2 F (36.8 C)     Temp Source 12/19/17 2032 Oral     SpO2 12/19/17 2032 97 %     Weight 12/19/17 2035 225 lb (102.1 kg)     Height 12/19/17 2035 5\' 10"  (1.778 m)     Head Circumference --      Peak Flow --      Pain Score 12/19/17 2032 7     Pain Loc --      Pain Edu? --      Excl. in [place]? --      Constitutional: Alert and oriented. Well  appearing and in no acute distress. Eyes: Conjunctivae are normal. PERRL. EOMI. Head: Atraumatic. ENT:      Ears: EACs and TMs unremarkable bilaterally.      Nose: No congestion/rhinnorhea.      Mouth/Throat: Mucous membranes are moist.  Oropharynx is nonerythematous and nonedematous.  No dentition and the proximal mandible or maxillary region right side.  No erythema or edema.  Uvula is midline.  Tonsils are unremarkable bilaterally.  External visualization of the jaw reveals no gross erythema or edema.  Palpation over the TMJ elicits tenderness, no other tenderness to palpation.  With opening and closing of the jaw, palpable "popping" sensation is appreciated. Neck: No stridor.  No cervical spine tenderness to palpation Hematological/Lymphatic/Immunilogical: No cervical lymphadenopathy.  No submandibular lymphadenopathy. Cardiovascular: Normal rate, regular rhythm. Normal S1 and S2.  Good peripheral circulation. Respiratory: Normal respiratory effort without tachypnea or retractions. Lungs CTAB. Good air entry to the bases with no decreased or absent breath sounds. Musculoskeletal: Full range of motion to all extremities. No gross deformities appreciated. Neurologic:  Normal speech and language. No gross focal neurologic deficits are appreciated.  Skin:  Skin is warm, dry and intact. No rash noted. Psychiatric: Mood and affect are normal. Speech and behavior are normal. Patient exhibits appropriate insight and judgement.   ____________________________________________   LABS (all labs ordered are listed, but only abnormal results are displayed)  Labs Reviewed - No data to display ____________________________________________  EKG   ____________________________________________  RADIOLOGY   No results found.  ____________________________________________    PROCEDURES  Procedure(s) performed:    Procedures    Medications - No data to  display   ____________________________________________   INITIAL IMPRESSION / ASSESSMENT AND PLAN / ED COURSE  Pertinent labs & imaging results that were available during my care of the patient were reviewed by me and considered in my medical decision making (see chart for details).  Review of the [place] CSRS was performed in accordance of the [place] prior to dispensing any controlled drugs.     Patient's diagnosis is consistent with TMJ inflammation right side.  Patient presented to the emergency department complaining of pain, swelling to the right TMJ region.  No significant appreciable edema is appreciated on exam.  Patient's pain location, physical exam findings are consistent with TMJ inflammation.  At this time, no indication of infection.  Differential included TMJ symptomology, parotiditis, dental/oropharyngeal infection.  At this time, there is no indication for labs or imaging.  Patient is given precautions to watch out for shortness being early infectious process without any acute physical exam findings.  Patient verbalizes understanding of same.  Patient will be discharged home with prescriptions for short course of prednisone for symptom improvement. Patient is to follow up with primary care as needed or otherwise directed. Patient is given ED precautions to return to the ED for any worsening or new symptoms.     ____________________________________________  FINAL CLINICAL IMPRESSION(S) / ED DIAGNOSES  Final diagnoses:  TMJ inflammation      NEW MEDICATIONS STARTED DURING THIS VISIT:  ED Discharge Orders        Ordered    predniSONE (DELTASONE) 50 MG tablet  Daily with breakfast     12/19/17 2220          This chart was dictated using voice recognition software/Dragon. Despite best efforts to proofread, errors can occur which can change the meaning. Any change was purely unintentional.    Darletta Moll, PA-C 12/19/17 2222    Harvest Dark,  MD 12/19/17 2236

## 2017-12-19 NOTE — ED Notes (Signed)
ED Provider at bedside. 

## 2017-12-19 NOTE — ED Triage Notes (Signed)
Pt presents to ED with right sided jaw/mouth pain an swelling. Painful with palpation. Pt states he does not have teeth on that side of his mouth but his pain did increase when attempting to eat dinner tonight. Pain has increased in severity until arrival to ED when it improved slightly. Swelling noted.

## 2017-12-21 ENCOUNTER — Other Ambulatory Visit: Payer: Self-pay | Admitting: Family Medicine

## 2017-12-21 DIAGNOSIS — J4520 Mild intermittent asthma, uncomplicated: Secondary | ICD-10-CM

## 2017-12-21 DIAGNOSIS — S46911A Strain of unspecified muscle, fascia and tendon at shoulder and upper arm level, right arm, initial encounter: Secondary | ICD-10-CM

## 2018-01-23 DIAGNOSIS — E1122 Type 2 diabetes mellitus with diabetic chronic kidney disease: Secondary | ICD-10-CM | POA: Diagnosis not present

## 2018-01-23 DIAGNOSIS — E1165 Type 2 diabetes mellitus with hyperglycemia: Secondary | ICD-10-CM | POA: Diagnosis not present

## 2018-01-23 DIAGNOSIS — N183 Chronic kidney disease, stage 3 (moderate): Secondary | ICD-10-CM | POA: Diagnosis not present

## 2018-01-23 DIAGNOSIS — I10 Essential (primary) hypertension: Secondary | ICD-10-CM | POA: Diagnosis not present

## 2018-01-23 DIAGNOSIS — E1142 Type 2 diabetes mellitus with diabetic polyneuropathy: Secondary | ICD-10-CM | POA: Diagnosis not present

## 2018-01-23 DIAGNOSIS — Z794 Long term (current) use of insulin: Secondary | ICD-10-CM | POA: Diagnosis not present

## 2018-02-07 DIAGNOSIS — E1140 Type 2 diabetes mellitus with diabetic neuropathy, unspecified: Secondary | ICD-10-CM | POA: Diagnosis not present

## 2018-02-07 DIAGNOSIS — B351 Tinea unguium: Secondary | ICD-10-CM | POA: Diagnosis not present

## 2018-02-07 DIAGNOSIS — Z794 Long term (current) use of insulin: Secondary | ICD-10-CM | POA: Diagnosis not present

## 2018-02-07 DIAGNOSIS — L851 Acquired keratosis [keratoderma] palmaris et plantaris: Secondary | ICD-10-CM | POA: Diagnosis not present

## 2018-02-18 ENCOUNTER — Other Ambulatory Visit: Payer: Self-pay | Admitting: Family Medicine

## 2018-08-09 ENCOUNTER — Telehealth: Payer: Self-pay | Admitting: Family Medicine

## 2018-08-09 NOTE — Telephone Encounter (Signed)
I left a message asking the pt to call and schedule AWV-I w/ [facility]. VDM (DD)

## 2018-08-18 ENCOUNTER — Other Ambulatory Visit: Payer: Self-pay | Admitting: Family Medicine

## 2018-08-21 ENCOUNTER — Other Ambulatory Visit: Payer: Self-pay | Admitting: Family Medicine

## 2020-12-12 ENCOUNTER — Ambulatory Visit: Payer: Medicare Other | Admitting: Urology

## 2020-12-12 ENCOUNTER — Encounter: Payer: Self-pay | Admitting: Urology

## 2020-12-12 ENCOUNTER — Other Ambulatory Visit: Payer: Self-pay

## 2020-12-12 VITALS — BP 142/85 | HR 84 | Ht 70.0 in | Wt 233.0 lb

## 2020-12-12 DIAGNOSIS — R972 Elevated prostate specific antigen [PSA]: Secondary | ICD-10-CM | POA: Diagnosis not present

## 2020-12-12 MED ORDER — TAMSULOSIN HCL 0.4 MG PO CAPS: 0.4000 mg | ORAL_CAPSULE | Freq: Every day | ORAL | 0 refills | Status: DC

## 2020-12-12 NOTE — Progress Notes (Signed)
12/12/2020 8:56 PM   ROURKE MCQUITTY 1956/08/08 [id number]  Referring provider: Kirk Ruths, MD [place] [facility] [place],  [place] [address]  Chief Complaint  Patient presents with  . Elevated PSA    HPI: [name] is a 65 y.o. male referred for evaluation of an elevated PSA.   PSA 12/01/20 elevated 5.02  Only prior PSA for comparison 04/2018 was 3.75  No bothersome LUTS  Denies dysuria, gross hematuria  Denies flank, abdominal or pelvic pain  No prior history urologic problems   PMH: Past Medical History:  Diagnosis Date  . Chest pain, unspecified    atypical  . Diabetes mellitus   . Hypertension   . Nausea & vomiting   . Shortness of breath     Surgical History: Past Surgical History:  Procedure Laterality Date  . NO PAST SURGERIES      Home Medications:  Allergies as of 12/12/2020   No Known Allergies     Medication List       Accurate as of December 12, 2020 11:59 PM. If you have any questions, ask your nurse or doctor.        albuterol 108 (90 Base) MCG/ACT inhaler Commonly known as: VENTOLIN HFA INHALE 2 PUFFS BY MOUTH EVERY 6 HOURS AS NEEDED FOR WHEEZING OR SHORTNESS OF BREATH   amLODipine 5 MG tablet Commonly known as: NORVASC Take 1 tablet by mouth daily.   econazole nitrate 1 % cream 1 application 2 (two) times daily.   fluticasone 50 MCG/ACT nasal spray Commonly known as: FLONASE SHAKE LIQUID AND USE 2 SPRAYS IN EACH NOSTRIL EVERY NIGHT AT BEDTIME   glipiZIDE 10 MG tablet Commonly known as: GLUCOTROL TK 1 T PO BID   EXPRESS MED TEST STRIP PACK VI 1 strip daily.   glucose blood test strip 1 strip 3 (three) times daily.   hydrochlorothiazide 25 MG tablet Commonly known as: HYDRODIURIL   ibuprofen 800 MG tablet Commonly known as: ADVIL TAKE 1 TABLET(800 MG) BY MOUTH THREE TIMES DAILY WITH MEALS   insulin glargine 100 UNIT/ML Solostar Pen Commonly known as: LANTUS Inject 60 Units into  the skin 1 day or 1 dose.   insulin lispro 100 UNIT/ML injection Commonly known as: HUMALOG Inject 5 Units into the skin 2 (two) times daily.   lisinopril 30 MG tablet Commonly known as: ZESTRIL Take 1 tablet (30 mg total) by mouth daily.   metFORMIN 1000 MG tablet Commonly known as: GLUCOPHAGE Take 1,000 mg by mouth 2 (two) times daily with a meal.   predniSONE 50 MG tablet Commonly known as: DELTASONE Take 1 tablet (50 mg total) by mouth daily with breakfast.   propranolol 40 MG tablet Commonly known as: INDERAL Take 40 mg by mouth 2 (two) times daily.   simvastatin 20 MG tablet Commonly known as: ZOCOR Take 1 tablet (20 mg total) by mouth at bedtime.   tamsulosin 0.4 MG Caps capsule Commonly known as: FLOMAX Take 1 capsule (0.4 mg total) by mouth daily. Started by: Abbie Sons, MD       Allergies: No Known Allergies  Family History: Family History  Problem Relation Age of Onset  . Cancer Father   . Diabetes Sister   . Hypertension Sister   . Heart attack Sister   . Seizures Brother   . Coronary artery disease Brother   . Diabetes Brother   . Hypertension Brother     Social History:  reports that  he has never smoked. He has never used smokeless tobacco. He reports that he does not drink alcohol and does not use drugs.   Physical Exam: BP (!) 142/85   Pulse 84   Ht 5\' 10"  (1.778 m)   Wt 233 lb (105.7 kg)   BMI 33.43 kg/m   Constitutional:  Alert and oriented, No acute distress. HEENT: [place] AT, moist mucus membranes.  Trachea midline, no masses. Cardiovascular: No clubbing, cyanosis, or edema. Respiratory: Normal respiratory effort, no increased work of breathing. GU: Prostate 40 g, smooth; upper one half not palpable secondary to body habitus Skin: No rashes, bruises or suspicious lesions. Neurologic: Grossly intact, no focal deficits, moving all 4 extremities. Psychiatric: Normal mood and affect.   Assessment & Plan:    1.  Elevated  PSA  Although PSA is a prostate cancer screening test he was informed that cancer is not the most common cause of an elevated PSA. Other potential causes including BPH and inflammation were discussed. He was informed that the only way to adequately diagnose prostate cancer would be a transrectal ultrasound and biopsy of the prostate. The procedure was discussed including potential risks of bleeding and infection/sepsis. He was also informed that a negative biopsy does not conclusively rule out the possibility that prostate cancer may be present and that continued monitoring is required. The use of newer adjunctive blood tests including PHI and 4kScore were discussed. The use of multiparametric prostate MRI to assess for lesions suspicious for high-grade prostate cancer as well as periodic surveillance was also discussed.  Initial trial tamsulosin 0.4 mg daily x30 days with a repeat PSA in 1 month  If PSA remains persistently elevated he will think over the above options.    Abbie Sons, [place] [address], [place], [place] [address] [phone]

## 2020-12-21 ENCOUNTER — Emergency Department
Admission: EM | Admit: 2020-12-21 | Discharge: 2020-12-21 | Disposition: A | Discharge status: Home / Self Care | Payer: Medicare Other | Attending: Emergency Medicine | Admitting: Emergency Medicine

## 2020-12-21 ENCOUNTER — Emergency Department: Payer: Medicare Other

## 2020-12-21 ENCOUNTER — Other Ambulatory Visit: Payer: Self-pay

## 2020-12-21 DIAGNOSIS — Z794 Long term (current) use of insulin: Secondary | ICD-10-CM | POA: Diagnosis not present

## 2020-12-21 DIAGNOSIS — R0789 Other chest pain: Secondary | ICD-10-CM | POA: Diagnosis not present

## 2020-12-21 DIAGNOSIS — M79602 Pain in left arm: Secondary | ICD-10-CM | POA: Diagnosis not present

## 2020-12-21 DIAGNOSIS — E1140 Type 2 diabetes mellitus with diabetic neuropathy, unspecified: Secondary | ICD-10-CM | POA: Insufficient documentation

## 2020-12-21 DIAGNOSIS — Z79899 Other long term (current) drug therapy: Secondary | ICD-10-CM | POA: Insufficient documentation

## 2020-12-21 DIAGNOSIS — I10 Essential (primary) hypertension: Secondary | ICD-10-CM | POA: Insufficient documentation

## 2020-12-21 DIAGNOSIS — Z7984 Long term (current) use of oral hypoglycemic drugs: Secondary | ICD-10-CM | POA: Insufficient documentation

## 2020-12-21 DIAGNOSIS — R079 Chest pain, unspecified: Secondary | ICD-10-CM | POA: Diagnosis present

## 2020-12-21 LAB — COMPREHENSIVE METABOLIC PANEL
ALT: 19 U/L (ref 0–44)
AST: 24 U/L (ref 15–41)
Albumin: 4.2 g/dL (ref 3.5–5.0)
Alkaline Phosphatase: 36 U/L — ABNORMAL LOW (ref 38–126)
Anion gap: 8 (ref 5–15)
BUN: 12 mg/dL (ref 8–23)
CO2: 29 mmol/L (ref 22–32)
Calcium: 9.2 mg/dL (ref 8.9–10.3)
Chloride: 102 mmol/L (ref 98–111)
Creatinine, Ser: 1.15 mg/dL (ref 0.61–1.24)
GFR, Estimated: >60 mL/min (ref >60)
Glucose, Bld: 103 mg/dL — ABNORMAL HIGH (ref 70–99)
Potassium: 3.7 mmol/L (ref 3.5–5.1)
Sodium: 139 mmol/L (ref 135–145)
Total Bilirubin: 0.6 mg/dL (ref 0.3–1.2)
Total Protein: 7.4 g/dL (ref 6.5–8.1)

## 2020-12-21 LAB — CBC WITH DIFFERENTIAL/PLATELET
Abs Immature Granulocytes: 0.02 10*3/uL (ref 0.00–0.07)
Basophils Absolute: 0 10*3/uL (ref 0.0–0.1)
Basophils Relative: 0 %
Eosinophils Absolute: 0.1 10*3/uL (ref 0.0–0.5)
Eosinophils Relative: 1 %
HCT: 40.1 % (ref 39.0–52.0)
Hemoglobin: 13.3 g/dL (ref 13.0–17.0)
Immature Granulocytes: 0 %
Lymphocytes Relative: 45 %
Lymphs Abs: 3.6 10*3/uL (ref 0.7–4.0)
MCH: 27.7 pg (ref 26.0–34.0)
MCHC: 33.2 g/dL (ref 30.0–36.0)
MCV: 83.4 fL (ref 80.0–100.0)
Monocytes Absolute: 0.7 10*3/uL (ref 0.1–1.0)
Monocytes Relative: 9 %
Neutro Abs: 3.6 10*3/uL (ref 1.7–7.7)
Neutrophils Relative %: 45 %
Platelets: 316 10*3/uL (ref 150–400)
RBC: 4.81 MIL/uL (ref 4.22–5.81)
RDW: 14 % (ref 11.5–15.5)
WBC: 8.1 10*3/uL (ref 4.0–10.5)
nRBC: 0 % (ref 0.0–0.2)

## 2020-12-21 LAB — TROPONIN I (HIGH SENSITIVITY): Troponin I (High Sensitivity): 14 ng/L (ref <18)

## 2020-12-21 LAB — BRAIN NATRIURETIC PEPTIDE: B Natriuretic Peptide: 33 pg/mL (ref 0.0–100.0)

## 2020-12-21 LAB — LIPASE, BLOOD: Lipase: 28 U/L (ref 11–51)

## 2020-12-21 NOTE — ED Provider Notes (Signed)
[facility] Emergency Department Provider Note  ____________________________________________   Event Date/Time   First MD Initiated Contact with Patient 12/21/20 1310     (approximate)  I have reviewed the triage vital signs and the nursing notes.   HISTORY  Chief Complaint Chest Pain  HPI [name] is a 65 y.o. male who presents to the emergency department for evaluation of left-sided chest pain that has been present intermittently since last week.  He describes a "weird" sensation across his chest initially but then had acute worsening last night of left-sided significant pain that was radiating down the left arm.  He denies any associated shortness of breath with this.  He denies any other sick contacts, nausea vomiting diarrhea or abdominal pain.  He has not had a recent fever.  He does report history of hypertension and states that he is very consistent with his antihypertensive medications.  He also reports being a type II diabetic and is compliant with medications.  He denies any other associated symptoms.       Past Medical History:  Diagnosis Date  . Chest pain, unspecified    atypical  . Diabetes mellitus   . Hypertension   . Nausea & vomiting   . Shortness of breath     Patient Active Problem List   Diagnosis Date Noted  . Special screening for malignant neoplasms, colon   . Polyp of sigmoid colon   . Intestinal lump   . Hypercholesterolemia 03/11/2017  . Peripheral neuropathy 03/15/2016  . Diabetic peripheral neuropathy ([place]) 03/15/2016  . Vitiligo 03/15/2016  . Nocturia 03/15/2016  . Blood in stool 03/15/2016  . Abdominal discomfort 01/07/2015  . Acute low back pain 01/07/2015  . Bronchitis 01/07/2015  . Confusion state 01/07/2015  . CN (constipation) 01/07/2015  . Cough, persistent 01/07/2015  . Dysthymic disorder 01/07/2015  . Breathlessness on exertion 01/07/2015  . Abdominal discomfort, epigastric 01/07/2015  .  Essential (primary) hypertension 01/07/2015  . Malaise and fatigue 01/07/2015  . Suicidal ideation 01/07/2015  . Tension type headache 01/07/2015  . Pain of thigh 01/07/2015  . Chest pain 10/28/2011  . Diabetes mellitus ([place]) 10/28/2011  . HTN (hypertension) 10/28/2011  . D (diarrhea) 10/17/2009  . Onychia of toe 01/31/2009  . Dermatophytic onychia 01/31/2009  . Diabetes mellitus type 2, uncontrolled ([place]) 06/02/2006    Past Surgical History:  Procedure Laterality Date  . NO PAST SURGERIES      Prior to Admission medications   Medication Sig Start Date End Date Taking? Authorizing Provider  albuterol (PROVENTIL HFA;VENTOLIN HFA) 108 (90 Base) MCG/ACT inhaler INHALE 2 PUFFS BY MOUTH EVERY 6 HOURS AS NEEDED FOR WHEEZING OR SHORTNESS OF BREATH 12/22/17   Bacigalupo, Dionne Bucy, MD  amLODipine (NORVASC) 5 MG tablet Take 1 tablet by mouth daily. 10/10/20   [provider]  econazole nitrate 1 % cream 1 application 2 (two) times daily. 08/26/14   [provider]  fluticasone (FLONASE) 50 MCG/ACT nasal spray SHAKE LIQUID AND USE 2 SPRAYS IN EACH NOSTRIL EVERY NIGHT AT BEDTIME 08/21/18   Chrismon, Dennis E, PA-C  glipiZIDE (GLUCOTROL) 10 MG tablet TK 1 T PO BID 06/25/15   [provider]  Glucose Blood (EXPRESS MED TEST STRIP PACK VI) 1 strip daily. 10/17/09   [provider]  glucose blood test strip 1 strip 3 (three) times daily. 01/24/14   [provider]  hydrochlorothiazide (HYDRODIURIL) 25 MG tablet  01/31/17   [provider]  ibuprofen (  ADVIL,MOTRIN) 800 MG tablet TAKE 1 TABLET(800 MG) BY MOUTH THREE TIMES DAILY WITH MEALS 12/22/17   Chrismon, Vickki Muff, PA-C  Insulin Glargine (LANTUS) 100 UNIT/ML Solostar Pen Inject 60 Units into the skin 1 day or 1 dose. 02/11/14   [provider]  insulin lispro (HUMALOG) 100 UNIT/ML injection Inject 5 Units into the skin 2 (two) times daily.    [provider]  lisinopril (PRINIVIL,ZESTRIL)  30 MG tablet Take 1 tablet (30 mg total) by mouth daily. 03/11/17   Chrismon, Vickki Muff, PA-C  metFORMIN (GLUCOPHAGE) 1000 MG tablet Take 1,000 mg by mouth 2 (two) times daily with a meal.    [provider]  predniSONE (DELTASONE) 50 MG tablet Take 1 tablet (50 mg total) by mouth daily with breakfast. 12/19/17   Cuthriell, Charline Bills, PA-C  propranolol (INDERAL) 40 MG tablet Take 40 mg by mouth 2 (two) times daily. 10/02/20   [provider]  simvastatin (ZOCOR) 20 MG tablet Take 1 tablet (20 mg total) by mouth at bedtime. 12/10/16   Chrismon, Vickki Muff, PA-C  tamsulosin (FLOMAX) 0.4 MG CAPS capsule Take 1 capsule (0.4 mg total) by mouth daily. 12/12/20   Abbie Sons, MD    Allergies Patient has no known allergies.  Family History  Problem Relation Age of Onset  . Cancer Father   . Diabetes Sister   . Hypertension Sister   . Heart attack Sister   . Seizures Brother   . Coronary artery disease Brother   . Diabetes Brother   . Hypertension Brother     Social History Social History   Tobacco Use  . Smoking status: Never Smoker  . Smokeless tobacco: Never Used  Vaping Use  . Vaping Use: Never used  Substance Use Topics  . Alcohol use: No  . Drug use: No    Review of Systems Constitutional: No fever/chills Eyes: No visual changes. ENT: No sore throat. Cardiovascular: + chest pain. Respiratory: Denies shortness of breath. Gastrointestinal: No abdominal pain.  No nausea, no vomiting.  No diarrhea.  No constipation. Genitourinary: Negative for dysuria. Musculoskeletal: Negative for back pain. Skin: Negative for rash. Neurological: Negative for headaches, focal weakness or numbness.   ____________________________________________   PHYSICAL EXAM:  VITAL SIGNS: ED Triage Vitals  Enc Vitals Group     BP 12/21/20 1313 (!) 186/110     Pulse Rate 12/21/20 1313 77     Resp 12/21/20 1313 20     Temp 12/21/20 1313 97.9 F (36.6 C)     Temp Source 12/21/20  1313 Oral     SpO2 12/21/20 1313 99 %     Weight 12/21/20 1320 240 lb (108.9 kg)     Height 12/21/20 1320 5\' 10"  (1.778 m)     Head Circumference --      Peak Flow --      Pain Score 12/21/20 1320 0     Pain Loc --      Pain Edu? --      Excl. in [place]? --    Constitutional: Alert and oriented. Well appearing and in no acute distress. Eyes: Conjunctivae are normal. PERRL. EOMI. Head: Atraumatic. Nose: No congestion/rhinnorhea. Mouth/Throat: Mucous membranes are moist.  Oropharynx non-erythematous. Neck: No stridor.   Cardiovascular: Chest pain not reproducible to palpation of the chest wall.  Normal rate, regular rhythm. Grossly normal heart sounds.  Good peripheral circulation.  No pitting edema. Respiratory: Normal respiratory effort.  No retractions. Lungs CTAB. Gastrointestinal: Soft and nontender.  No distention. No abdominal bruits. No CVA tenderness. Musculoskeletal: No lower extremity tenderness nor edema.  No joint effusions. Neurologic:  Normal speech and language. No gross focal neurologic deficits are appreciated. No gait instability. Skin:  Skin is warm, dry and intact. No rash noted. Psychiatric: Mood and affect are normal. Speech and behavior are normal.  ____________________________________________   LABS (all labs ordered are listed, but only abnormal results are displayed)  Labs Reviewed  COMPREHENSIVE METABOLIC PANEL - Abnormal; Notable for the following components:      Result Value   Glucose, Bld 103 (*)    Alkaline Phosphatase 36 (*)    All other components within normal limits  CBC WITH DIFFERENTIAL/PLATELET  BRAIN NATRIURETIC PEPTIDE  LIPASE, BLOOD  TROPONIN I (HIGH SENSITIVITY)   ____________________________________________  EKG  Normal sinus rhythm with a rate of 75 bpm.  No ST elevations or depressions to suggest acute ischemia. ____________________________________________  RADIOLOGY I, Marlana Salvage, personally viewed and evaluated these  images (plain radiographs) as part of my medical decision making, as well as reviewing the written report by the radiologist.  ED provider interpretation: No focal pneumonia or other acute change  Official radiology report(s): DG Chest 1 View  Result Date: 12/21/2020 CLINICAL DATA:  Chest pain for 6 days. History of diabetes and hypertension. EXAM: CHEST  1 VIEW COMPARISON:  Chest x-ray dated 08/10/2015. FINDINGS: Heart size and mediastinal contours are stable. Lungs are clear. No pleural effusion or pneumothorax is seen. Osseous structures about the chest are unremarkable. IMPRESSION: No active disease. No evidence of pneumonia or pulmonary edema. Electronically Signed   By: Franki Cabot M.D.   On: 12/21/2020 14:09   ____________________________________________   INITIAL IMPRESSION / ASSESSMENT AND PLAN / ED COURSE  As part of my medical decision making, I reviewed the following data within the [facility] notes reviewed and incorporated, Labs reviewed, Radiograph reviewed and Notes from prior ED visits        Patient is a 65 year old male who presents to the emergency department for 1 week of his chest feeling "weird" with acute episode of significant pain that occurred last night that has eased off some today.  He has history of hypertension, diabetes, hyperlipidemia, no known reported history of CAD.  See HPI for further details.  In triage, the patient was hypertensive at a rate of 186/110, however this did significantly improve during the course of his evaluation and improved to 131/92 at discharge.  The remainder of his vitals are unremarkable.  The patient's evaluation included CBC, CMP, BNP, troponin, lipase which were all grossly within normal limits.  EKG and chest x-ray are also grossly within normal limits.  Case was discussed with Dr. Hulan Saas, who was present for the medical decision making for the patient's care.  Overall, the patient's laboratory  evaluation and physical exam are reassuring.  Suspect noncardiac cause for the patient's current chest pain.  Recommended close follow-up with PCP, or return to the emergency department if he experiences any worsening.  Patient is amenable this plan is stable this time for outpatient management.      ____________________________________________   FINAL CLINICAL IMPRESSION(S) / ED DIAGNOSES  Final diagnoses:  Atypical chest pain     ED Discharge Orders    None      *Please note:  [name] was evaluated in Emergency Department on 12/21/2020 for the symptoms described in the history of present illness. He was evaluated in the context  of the global COVID-19 pandemic, which necessitated consideration that the patient might be at risk for infection with the SARS-CoV-2 virus that causes COVID-19. Institutional protocols and algorithms that pertain to the evaluation of patients at risk for COVID-19 are in a state of rapid change based on information released by regulatory bodies including the CDC and federal and state organizations. These policies and algorithms were followed during the patient's care in the ED.  Some ED evaluations and interventions may be delayed as a result of limited staffing during and the pandemic.*   Note:  This document was prepared using Dragon voice recognition software and may include unintentional dictation errors.   Marlana Salvage, PA 12/21/20 2040    Lucrezia Starch, MD 12/22/20 1452

## 2020-12-21 NOTE — ED Triage Notes (Signed)
Pt via POV from home. Pt c/o L sided non-radiating CP for 6 days. Denies SOB. Denies NVD. Denies fever. Pt is A&Ox4 and NAD.

## 2020-12-21 NOTE — Discharge Instructions (Addendum)
Please continue to take your medications as previously prescribed.  Return to the emergency department if you experience any worsening or changes in your symptoms.  Otherwise, follow-up with primary care.

## 2021-01-01 ENCOUNTER — Other Ambulatory Visit: Payer: Self-pay | Admitting: *Deleted

## 2021-01-01 DIAGNOSIS — R972 Elevated prostate specific antigen [PSA]: Secondary | ICD-10-CM

## 2021-01-14 ENCOUNTER — Other Ambulatory Visit: Payer: Self-pay

## 2021-01-14 ENCOUNTER — Other Ambulatory Visit: Payer: Medicare Other

## 2021-01-14 DIAGNOSIS — R972 Elevated prostate specific antigen [PSA]: Secondary | ICD-10-CM

## 2021-01-15 ENCOUNTER — Telehealth: Payer: Self-pay | Admitting: Urology

## 2021-01-15 LAB — PSA: Prostate Specific Ag, Serum: 7.4 ng/mL — ABNORMAL HIGH (ref 0.0–4.0)

## 2021-01-15 NOTE — Telephone Encounter (Signed)
Notified patient as instructed, patient states he will call us back after he makes his mind up

## 2021-01-15 NOTE — Telephone Encounter (Signed)
Repeat PSA is higher at 7.4 indicating possibility of prostate cancer.  At last office visit we had discussed options of obtaining a prostate MRI versus prostate biopsy.  Please let me know if he has a preference or if he has any questions.

## 2021-01-16 ENCOUNTER — Encounter: Payer: Self-pay | Admitting: *Deleted

## 2021-02-02 ENCOUNTER — Ambulatory Visit (INDEPENDENT_AMBULATORY_CARE_PROVIDER_SITE_OTHER): Payer: Medicare Other | Admitting: Urology

## 2021-02-02 ENCOUNTER — Encounter: Payer: Self-pay | Admitting: Urology

## 2021-02-02 ENCOUNTER — Other Ambulatory Visit: Payer: Self-pay

## 2021-02-02 VITALS — BP 126/82 | HR 74 | Ht 70.0 in | Wt 242.0 lb

## 2021-02-02 DIAGNOSIS — R972 Elevated prostate specific antigen [PSA]: Secondary | ICD-10-CM

## 2021-02-02 MED ORDER — LEVOFLOXACIN 500 MG PO TABS
500.0000 mg | ORAL_TABLET | Freq: Once | ORAL | Status: AC
  Administered 2021-02-02: 500 mg via ORAL

## 2021-02-02 MED ORDER — GENTAMICIN SULFATE 40 MG/ML IJ SOLN
80.0000 mg | Freq: Once | INTRAMUSCULAR | Status: AC
  Administered 2021-02-02: 80 mg via INTRAMUSCULAR

## 2021-02-02 NOTE — Progress Notes (Signed)
Prostate Biopsy Procedure   Informed consent was obtained after discussing risks/benefits of the procedure.  A time out was performed to ensure correct patient identity.  Pre-Procedure:  - Last PSA Level: 7.4 (01/14/2021) -  -Ceftriaxone given prophylactically -Transrectal Ultrasound performed revealing a 44 gm prostate -No significant hypoechoic or median lobe noted  Procedure: - Prostate block performed using 10 cc 1% lidocaine and biopsies taken from sextant areas, a total of 12 under ultrasound guidance.  Post-Procedure: - Patient tolerated the procedure well - He was counseled to seek immediate medical attention if experiences any severe pain, significant bleeding, or fevers - Return in one week to discuss biopsy results   John Giovanni, MD

## 2021-02-04 LAB — SURGICAL PATHOLOGY

## 2021-02-05 ENCOUNTER — Telehealth: Payer: Self-pay | Admitting: Urology

## 2021-02-05 NOTE — Telephone Encounter (Signed)
Please notify patient his prostate biopsy showed no evidence of cancer.  If he is not having any postbiopsy problems can cancel his 6/22 follow-up appointment and reschedule to 47-month follow-up office visit with PSA prior

## 2021-02-06 NOTE — Telephone Encounter (Signed)
Notified patient as instructed, patient pleased. Discussed follow-up appointments, patient agrees  

## 2021-02-11 ENCOUNTER — Ambulatory Visit: Payer: Self-pay | Admitting: Urology

## 2021-08-05 ENCOUNTER — Other Ambulatory Visit: Payer: Self-pay

## 2021-08-05 DIAGNOSIS — R972 Elevated prostate specific antigen [PSA]: Secondary | ICD-10-CM

## 2021-08-10 ENCOUNTER — Other Ambulatory Visit: Payer: Self-pay

## 2021-08-14 ENCOUNTER — Ambulatory Visit: Payer: Self-pay | Admitting: Urology

## 2021-08-21 ENCOUNTER — Ambulatory Visit: Payer: Medicare Other | Admitting: Urology

## 2021-09-07 ENCOUNTER — Encounter: Payer: Self-pay | Admitting: *Deleted

## 2022-08-02 ENCOUNTER — Telehealth: Payer: Self-pay | Admitting: Urology

## 2022-08-02 NOTE — Telephone Encounter (Signed)
Patient due for follow-up appointment.  Please schedule

## 2022-08-02 NOTE — Telephone Encounter (Signed)
Patient does not want to be seen in office. He states if we schedule appt he will call and c/x  it.

## 2022-08-06 ENCOUNTER — Encounter: Admission: RE | Payer: Self-pay | Source: Ambulatory Visit

## 2022-08-06 ENCOUNTER — Ambulatory Visit: Admission: RE | Admit: 2022-08-06 | Payer: Medicare Other | Source: Ambulatory Visit | Admitting: Gastroenterology

## 2022-08-06 SURGERY — COLONOSCOPY WITH PROPOFOL
Anesthesia: General

## 2022-09-24 ENCOUNTER — Ambulatory Visit
Admission: RE | Admit: 2022-09-24 | Discharge: 2022-09-24 | Disposition: A | Discharge status: Home / Self Care | Payer: Medicare Other | Source: Ambulatory Visit | Attending: Internal Medicine | Admitting: Internal Medicine

## 2022-09-24 ENCOUNTER — Other Ambulatory Visit: Payer: Self-pay | Admitting: Internal Medicine

## 2022-09-24 DIAGNOSIS — M25512 Pain in left shoulder: Secondary | ICD-10-CM

## 2023-11-10 ENCOUNTER — Other Ambulatory Visit: Payer: Self-pay | Admitting: Internal Medicine

## 2023-11-10 ENCOUNTER — Ambulatory Visit
Admission: RE | Admit: 2023-11-10 | Discharge: 2023-11-10 | Disposition: A | Discharge status: Home / Self Care | Source: Ambulatory Visit | Attending: Internal Medicine | Admitting: Internal Medicine

## 2023-11-10 ENCOUNTER — Ambulatory Visit
Admission: RE | Admit: 2023-11-10 | Discharge: 2023-11-10 | Disposition: A | Discharge status: Home / Self Care | Source: Ambulatory Visit | Attending: Internal Medicine | Admitting: *Deleted

## 2023-11-10 DIAGNOSIS — J069 Acute upper respiratory infection, unspecified: Secondary | ICD-10-CM

## 2024-03-12 ENCOUNTER — Other Ambulatory Visit: Payer: Self-pay | Admitting: Internal Medicine

## 2024-03-12 DIAGNOSIS — R0989 Other specified symptoms and signs involving the circulatory and respiratory systems: Secondary | ICD-10-CM

## 2024-03-26 ENCOUNTER — Other Ambulatory Visit: Payer: Self-pay | Admitting: Internal Medicine

## 2024-03-26 DIAGNOSIS — R0989 Other specified symptoms and signs involving the circulatory and respiratory systems: Secondary | ICD-10-CM

## 2024-03-27 ENCOUNTER — Ambulatory Visit
Admission: RE | Admit: 2024-03-27 | Discharge: 2024-03-27 | Disposition: A | Discharge status: Home / Self Care | Source: Ambulatory Visit | Attending: Internal Medicine | Admitting: Internal Medicine

## 2024-03-27 DIAGNOSIS — R0989 Other specified symptoms and signs involving the circulatory and respiratory systems: Secondary | ICD-10-CM | POA: Diagnosis present

## 2024-07-12 NOTE — Progress Notes (Signed)
 Subjective:  Pt. returns  For diabetic foot care. Some paresthesias in feet.  Objective:  Skin is dry and atrophic with bilateral edema.  Diminished hair growth.  All 10 toenails are thick, dystrophic and discolored, brittle with subungual debris.  Impression:  Diabetes with onychomycosis.   Plan:  Debridement of all 10 toenails in length and thickness sharply using toenail nippers.   Return to clinic as needed.

## 2024-07-15 ENCOUNTER — Other Ambulatory Visit: Payer: Self-pay

## 2024-07-15 ENCOUNTER — Emergency Department

## 2024-07-15 ENCOUNTER — Inpatient Hospital Stay
Admit: 2024-07-15 | Discharge: 2024-07-15 | Disposition: A | Discharge status: Home / Self Care | Attending: Internal Medicine | Admitting: Internal Medicine

## 2024-07-15 ENCOUNTER — Inpatient Hospital Stay
Admission: EM | Admit: 2024-07-15 | Discharge: 2024-07-17 | DRG: 291 | Disposition: A | Discharge status: Home / Self Care | Attending: Internal Medicine | Admitting: Internal Medicine

## 2024-07-15 DIAGNOSIS — Z1152 Encounter for screening for COVID-19: Secondary | ICD-10-CM | POA: Diagnosis not present

## 2024-07-15 DIAGNOSIS — Z8249 Family history of ischemic heart disease and other diseases of the circulatory system: Secondary | ICD-10-CM | POA: Diagnosis not present

## 2024-07-15 DIAGNOSIS — E1142 Type 2 diabetes mellitus with diabetic polyneuropathy: Secondary | ICD-10-CM | POA: Diagnosis present

## 2024-07-15 DIAGNOSIS — I509 Heart failure, unspecified: Secondary | ICD-10-CM | POA: Diagnosis not present

## 2024-07-15 DIAGNOSIS — Z794 Long term (current) use of insulin: Secondary | ICD-10-CM | POA: Diagnosis not present

## 2024-07-15 DIAGNOSIS — R7989 Other specified abnormal findings of blood chemistry: Secondary | ICD-10-CM | POA: Insufficient documentation

## 2024-07-15 DIAGNOSIS — E876 Hypokalemia: Secondary | ICD-10-CM | POA: Diagnosis present

## 2024-07-15 DIAGNOSIS — J9601 Acute respiratory failure with hypoxia: Secondary | ICD-10-CM | POA: Diagnosis present

## 2024-07-15 DIAGNOSIS — D649 Anemia, unspecified: Secondary | ICD-10-CM | POA: Diagnosis present

## 2024-07-15 DIAGNOSIS — Z7985 Long-term (current) use of injectable non-insulin antidiabetic drugs: Secondary | ICD-10-CM

## 2024-07-15 DIAGNOSIS — Z7984 Long term (current) use of oral hypoglycemic drugs: Secondary | ICD-10-CM

## 2024-07-15 DIAGNOSIS — Z820 Family history of epilepsy and other diseases of the nervous system: Secondary | ICD-10-CM

## 2024-07-15 DIAGNOSIS — Z79899 Other long term (current) drug therapy: Secondary | ICD-10-CM | POA: Diagnosis not present

## 2024-07-15 DIAGNOSIS — Z809 Family history of malignant neoplasm, unspecified: Secondary | ICD-10-CM

## 2024-07-15 DIAGNOSIS — E1100 Type 2 diabetes mellitus with hyperosmolarity without nonketotic hyperglycemic-hyperosmolar coma (NKHHC): Secondary | ICD-10-CM

## 2024-07-15 DIAGNOSIS — I10 Essential (primary) hypertension: Secondary | ICD-10-CM | POA: Diagnosis present

## 2024-07-15 DIAGNOSIS — E7800 Pure hypercholesterolemia, unspecified: Secondary | ICD-10-CM | POA: Diagnosis present

## 2024-07-15 DIAGNOSIS — Z7982 Long term (current) use of aspirin: Secondary | ICD-10-CM

## 2024-07-15 DIAGNOSIS — I5031 Acute diastolic (congestive) heart failure: Secondary | ICD-10-CM | POA: Diagnosis present

## 2024-07-15 DIAGNOSIS — Z833 Family history of diabetes mellitus: Secondary | ICD-10-CM | POA: Diagnosis not present

## 2024-07-15 DIAGNOSIS — I110 Hypertensive heart disease with heart failure: Principal | ICD-10-CM | POA: Diagnosis present

## 2024-07-15 DIAGNOSIS — E119 Type 2 diabetes mellitus without complications: Secondary | ICD-10-CM

## 2024-07-15 LAB — BASIC METABOLIC PANEL WITH GFR
Anion gap: 11 (ref 5–15)
BUN: 14 mg/dL (ref 8–23)
CO2: 26 mmol/L (ref 22–32)
Calcium: 8.7 mg/dL — ABNORMAL LOW (ref 8.9–10.3)
Chloride: 102 mmol/L (ref 98–111)
Creatinine, Ser: 1 mg/dL (ref 0.61–1.24)
GFR, Estimated: >60 mL/min (ref >60)
Glucose, Bld: 233 mg/dL — ABNORMAL HIGH (ref 70–99)
Potassium: 3.3 mmol/L — ABNORMAL LOW (ref 3.5–5.1)
Sodium: 138 mmol/L (ref 135–145)

## 2024-07-15 LAB — GLUCOSE, CAPILLARY
Glucose-Capillary: 206 mg/dL — ABNORMAL HIGH (ref 70–99)
Glucose-Capillary: 171 mg/dL — ABNORMAL HIGH (ref 70–99)
Glucose-Capillary: 255 mg/dL — ABNORMAL HIGH (ref 70–99)

## 2024-07-15 LAB — CBC
HCT: 35.7 % — ABNORMAL LOW (ref 39.0–52.0)
Hemoglobin: 11.2 g/dL — ABNORMAL LOW (ref 13.0–17.0)
MCH: 25.9 pg — ABNORMAL LOW (ref 26.0–34.0)
MCHC: 31.4 g/dL (ref 30.0–36.0)
MCV: 82.6 fL (ref 80.0–100.0)
Platelets: 283 K/uL (ref 150–400)
RBC: 4.32 MIL/uL (ref 4.22–5.81)
RDW: 14.6 % (ref 11.5–15.5)
WBC: 6.5 K/uL (ref 4.0–10.5)
nRBC: 0 % (ref 0.0–0.2)

## 2024-07-15 LAB — TSH: TSH: 1.27 u[IU]/mL (ref 0.350–4.500)

## 2024-07-15 LAB — ECHOCARDIOGRAM COMPLETE
AR max vel: 1.55 cm2
AV Area VTI: 1.74 cm2
AV Area mean vel: 1.58 cm2
AV Mean grad: 10 mmHg
AV Peak grad: 18.2 mmHg
Ao pk vel: 2.14 m/s
Area-P 1/2: 4.65 cm2
Height: 70 in
S' Lateral: 3 cm
Weight: 3760 [oz_av]

## 2024-07-15 LAB — LIPID PANEL
Cholesterol: 170 mg/dL (ref 0–200)
HDL: 53 mg/dL (ref >40)
LDL Cholesterol: 100 mg/dL — ABNORMAL HIGH (ref 0–99)
Total CHOL/HDL Ratio: 3.2 ratio
Triglycerides: 84 mg/dL (ref <150)
VLDL: 17 mg/dL (ref 0–40)

## 2024-07-15 LAB — HEMOGLOBIN A1C
Hgb A1c MFr Bld: 9.2 % — ABNORMAL HIGH (ref 4.8–5.6)
Mean Plasma Glucose: 217.34 mg/dL

## 2024-07-15 LAB — RESP PANEL BY RT-PCR (RSV, FLU A&B, COVID)  RVPGX2
Influenza A by PCR: NEGATIVE
Influenza B by PCR: NEGATIVE
Resp Syncytial Virus by PCR: NEGATIVE
SARS Coronavirus 2 by RT PCR: NEGATIVE

## 2024-07-15 LAB — TROPONIN T, HIGH SENSITIVITY
Troponin T High Sensitivity: 39 ng/L — ABNORMAL HIGH (ref 0–19)
Troponin T High Sensitivity: 48 ng/L — ABNORMAL HIGH (ref 0–19)
Troponin T High Sensitivity: 48 ng/L — ABNORMAL HIGH (ref 0–19)

## 2024-07-15 LAB — HIV ANTIBODY (ROUTINE TESTING W REFLEX): HIV Screen 4th Generation wRfx: NONREACTIVE

## 2024-07-15 LAB — MAGNESIUM: Magnesium: 1.7 mg/dL (ref 1.7–2.4)

## 2024-07-15 LAB — PRO BRAIN NATRIURETIC PEPTIDE: Pro Brain Natriuretic Peptide: 152 pg/mL (ref <300.0)

## 2024-07-15 LAB — PROCALCITONIN: Procalcitonin: <0.1 ng/mL

## 2024-07-15 MED ORDER — ACETAMINOPHEN 325 MG PO TABS
650.0000 mg | ORAL_TABLET | Freq: Four times a day (QID) | ORAL | Status: DC | PRN
  Administered 2024-07-15 – 2024-07-16 (×2): 650 mg via ORAL
  Filled 2024-07-15 (×2): qty 2

## 2024-07-15 MED ORDER — ASPIRIN 81 MG PO TBEC
81.0000 mg | DELAYED_RELEASE_TABLET | Freq: Every day | ORAL | Status: DC
  Administered 2024-07-16 – 2024-07-17 (×2): 81 mg via ORAL
  Filled 2024-07-15 (×3): qty 1

## 2024-07-15 MED ORDER — ASPIRIN 81 MG PO CHEW: 81.0000 mg | CHEWABLE_TABLET | Freq: Every day | ORAL | Status: DC

## 2024-07-15 MED ORDER — GABAPENTIN 300 MG PO CAPS
300.0000 mg | ORAL_CAPSULE | Freq: Two times a day (BID) | ORAL | Status: DC
  Administered 2024-07-15 – 2024-07-17 (×5): 300 mg via ORAL
  Filled 2024-07-15 (×5): qty 1

## 2024-07-15 MED ORDER — MELATONIN 5 MG PO TABS
5.0000 mg | ORAL_TABLET | Freq: Every evening | ORAL | Status: DC | PRN
Start: 2024-07-15 — End: 2024-07-17
  Administered 2024-07-15 – 2024-07-16 (×2): 5 mg via ORAL
  Filled 2024-07-15 (×2): qty 1

## 2024-07-15 MED ORDER — ASPIRIN 81 MG PO CHEW
324.0000 mg | CHEWABLE_TABLET | Freq: Once | ORAL | Status: AC
  Administered 2024-07-15: 324 mg via ORAL
  Filled 2024-07-15: qty 4

## 2024-07-15 MED ORDER — ACETAMINOPHEN 650 MG RE SUPP: 650.0000 mg | Freq: Four times a day (QID) | RECTAL | Status: DC | PRN

## 2024-07-15 MED ORDER — DAPAGLIFLOZIN PROPANEDIOL 10 MG PO TABS
10.0000 mg | ORAL_TABLET | Freq: Every day | ORAL | Status: DC
  Administered 2024-07-15 – 2024-07-17 (×3): 10 mg via ORAL
  Filled 2024-07-15 (×3): qty 1

## 2024-07-15 MED ORDER — AMLODIPINE BESYLATE 5 MG PO TABS
5.0000 mg | ORAL_TABLET | Freq: Every day | ORAL | Status: DC
  Administered 2024-07-15 – 2024-07-17 (×3): 5 mg via ORAL
  Filled 2024-07-15 (×3): qty 1

## 2024-07-15 MED ORDER — POLYETHYLENE GLYCOL 3350 17 G PO PACK: 17.0000 g | PACK | Freq: Every day | ORAL | Status: DC | PRN

## 2024-07-15 MED ORDER — CARVEDILOL 6.25 MG PO TABS: 6.2500 mg | ORAL_TABLET | Freq: Two times a day (BID) | ORAL | Status: DC

## 2024-07-15 MED ORDER — MAGNESIUM SULFATE IN D5W 1-5 GM/100ML-% IV SOLN
1.0000 g | Freq: Once | INTRAVENOUS | Status: AC
  Administered 2024-07-15: 1 g via INTRAVENOUS
  Filled 2024-07-15: qty 100

## 2024-07-15 MED ORDER — FUROSEMIDE 10 MG/ML IJ SOLN
60.0000 mg | Freq: Once | INTRAMUSCULAR | Status: AC
  Administered 2024-07-15: 60 mg via INTRAVENOUS
  Filled 2024-07-15: qty 8

## 2024-07-15 MED ORDER — POTASSIUM CHLORIDE CRYS ER 20 MEQ PO TBCR
40.0000 meq | EXTENDED_RELEASE_TABLET | Freq: Four times a day (QID) | ORAL | Status: AC
  Administered 2024-07-15 (×2): 40 meq via ORAL
  Filled 2024-07-15 (×2): qty 2

## 2024-07-15 MED ORDER — FUROSEMIDE 10 MG/ML IJ SOLN
40.0000 mg | Freq: Two times a day (BID) | INTRAMUSCULAR | Status: DC
  Administered 2024-07-15 – 2024-07-16 (×3): 40 mg via INTRAVENOUS
  Filled 2024-07-15 (×3): qty 4

## 2024-07-15 MED ORDER — INSULIN ASPART 100 UNIT/ML IJ SOLN
0.0000 [IU] | Freq: Three times a day (TID) | INTRAMUSCULAR | Status: DC
  Administered 2024-07-15: 4 [IU] via SUBCUTANEOUS
  Administered 2024-07-15: 7 [IU] via SUBCUTANEOUS
  Administered 2024-07-16: 4 [IU] via SUBCUTANEOUS
  Administered 2024-07-16: 3 [IU] via SUBCUTANEOUS
  Administered 2024-07-17: 7 [IU] via SUBCUTANEOUS
  Filled 2024-07-15: qty 4
  Filled 2024-07-15: qty 7
  Filled 2024-07-15: qty 3
  Filled 2024-07-15: qty 7
  Filled 2024-07-15: qty 4

## 2024-07-15 MED ORDER — INSULIN ASPART 100 UNIT/ML IJ SOLN
0.0000 [IU] | Freq: Every day | INTRAMUSCULAR | Status: DC
  Administered 2024-07-15: 3 [IU] via SUBCUTANEOUS
  Administered 2024-07-16: 2 [IU] via SUBCUTANEOUS
  Filled 2024-07-15: qty 2
  Filled 2024-07-15: qty 3

## 2024-07-15 MED ORDER — LOSARTAN POTASSIUM 50 MG PO TABS
50.0000 mg | ORAL_TABLET | Freq: Every day | ORAL | Status: DC
  Administered 2024-07-15 – 2024-07-17 (×3): 50 mg via ORAL
  Filled 2024-07-15 (×3): qty 1

## 2024-07-15 MED ORDER — INSULIN GLARGINE-YFGN 100 UNIT/ML ~~LOC~~ SOLN
100.0000 [IU] | Freq: Every day | SUBCUTANEOUS | Status: DC
  Administered 2024-07-15 – 2024-07-16 (×2): 100 [IU] via SUBCUTANEOUS
  Filled 2024-07-15 (×3): qty 1

## 2024-07-15 MED ORDER — ALBUTEROL SULFATE (2.5 MG/3ML) 0.083% IN NEBU: 2.5000 mg | INHALATION_SOLUTION | RESPIRATORY_TRACT | Status: DC | PRN

## 2024-07-15 MED ORDER — SPIRONOLACTONE 12.5 MG HALF TABLET
12.5000 mg | ORAL_TABLET | Freq: Every day | ORAL | Status: DC
  Administered 2024-07-15: 12.5 mg via ORAL
  Filled 2024-07-15 (×2): qty 1

## 2024-07-15 MED ORDER — ENOXAPARIN SODIUM 60 MG/0.6ML IJ SOSY
0.5000 mg/kg | PREFILLED_SYRINGE | INTRAMUSCULAR | Status: DC
  Administered 2024-07-15 – 2024-07-16 (×2): 52.5 mg via SUBCUTANEOUS
  Filled 2024-07-15 (×2): qty 0.6

## 2024-07-15 MED ORDER — IPRATROPIUM-ALBUTEROL 0.5-2.5 (3) MG/3ML IN SOLN
3.0000 mL | Freq: Once | RESPIRATORY_TRACT | Status: AC
  Administered 2024-07-15: 3 mL via RESPIRATORY_TRACT
  Filled 2024-07-15: qty 9

## 2024-07-15 MED ORDER — METFORMIN HCL 500 MG PO TABS
1000.0000 mg | ORAL_TABLET | Freq: Two times a day (BID) | ORAL | Status: DC
  Administered 2024-07-15 – 2024-07-16 (×3): 1000 mg via ORAL
  Filled 2024-07-15 (×3): qty 2

## 2024-07-15 MED ORDER — ONDANSETRON HCL 4 MG/2ML IJ SOLN: 4.0000 mg | Freq: Four times a day (QID) | INTRAMUSCULAR | Status: DC | PRN

## 2024-07-15 MED ORDER — ONDANSETRON HCL 4 MG PO TABS: 4.0000 mg | ORAL_TABLET | Freq: Four times a day (QID) | ORAL | Status: DC | PRN

## 2024-07-15 NOTE — ED Notes (Signed)
 Ambulating pulse oximetry at 94% while ambulating

## 2024-07-15 NOTE — H&P (Signed)
 History and Physical    [name] FMW:[id number] DOB: January 25, 1956 DOA: 07/15/2024  DOS: the patient was seen and examined on 07/15/2024  PCP: Entzminger, Ethridge LABOR, MD   Patient coming from: Home  I have personally briefly reviewed patient's old medical records in [facility] and CareEverywhere  HPI:  [name] is a 68 y.o. year old male with past medical history of type 2 diabetes mellitus with peripheral neuropathy, hypertension, hyperlipidemia, and vitiligo.  He presents to [facility] regional ED with reports of dyspnea.  This has been ongoing for about 6 months however he noted it to be particularly worse over the last couple of days and further worsened last night prompting him to present to the ED.  He reports that over the last 6 months he notices that he is dyspneic and fatigue when walking and often has to rest or take a breath after coming down the steps or prior to going back up the steps.  He has had episodes of intermittent chest pain at rest over the same interval though he denies chest pain here today.  He also endorses bilateral lower extremity edema and orthopnea.  At present he denies chest pain, palpitations, fever, abdominal pain, nausea, vomiting.  He has not had any known sick contacts.  He reports he was supposed to have outpatient workup with cardiology but has not had that appointment yet.  ED Course: On arrival to [facility] regional ED patient was noted to be afebrile temp 36.8 C, BP 171/105, HR 100, RR 26, SpO2 86% on room air.  CXR obtained and shows patchy bibasilar opacity and changes consistent with mild CHF.  Labs notable for negative COVID, flu, RSV.  Potassium 3.3, glucose elevated 233, hemoglobin 11.2, proBNP 152, troponin 39-->48.  He was given 60 mg IV Lasix , DuoNeb, and 324 mg of ASA. TRH contacted for admission.  Review of Systems: As mentioned in the history of present illness. All other systems reviewed and are negative.   Past Medical  History:  Diagnosis Date   Chest pain, unspecified    atypical   Diabetes mellitus    Hypertension    Nausea & vomiting    Shortness of breath     Past Surgical History:  Procedure Laterality Date   NO PAST SURGERIES       reports that he has never smoked. He has never used smokeless tobacco. He reports that he does not drink alcohol and does not use drugs.  No Known Allergies  Family History  Problem Relation Age of Onset   Cancer Father    Diabetes Sister    Hypertension Sister    Heart attack Sister    Seizures Brother    Coronary artery disease Brother    Diabetes Brother    Hypertension Brother     Prior to Admission medications   Medication Sig Start Date End Date Taking? Authorizing Provider  albuterol  (PROVENTIL  HFA;VENTOLIN  HFA) 108 (90 Base) MCG/ACT inhaler INHALE 2 PUFFS BY MOUTH EVERY 6 HOURS AS NEEDED FOR WHEEZING OR SHORTNESS OF BREATH 12/22/17   Bacigalupo, Jon HERO, MD  amLODipine  (NORVASC ) 5 MG tablet Take 1 tablet by mouth daily. 10/10/20   [provider]  econazole nitrate 1 % cream 1 application 2 (two) times daily. 08/26/14   [provider]  fluticasone (FLONASE) 50 MCG/ACT nasal spray SHAKE LIQUID AND USE 2 SPRAYS IN EACH NOSTRIL EVERY NIGHT AT BEDTIME 08/21/18   Chrismon, Marinda E, PA-C  glipiZIDE (GLUCOTROL)  10 MG tablet TK 1 T PO BID 06/25/15   [provider]  Glucose Blood (EXPRESS MED TEST STRIP PACK VI) 1 strip daily. 10/17/09   [provider]  glucose blood test strip 1 strip 3 (three) times daily. 01/24/14   [provider]  hydrochlorothiazide (HYDRODIURIL) 25 MG tablet  01/31/17   [provider]  ibuprofen  (ADVIL ,MOTRIN ) 800 MG tablet TAKE 1 TABLET(800 MG) BY MOUTH THREE TIMES DAILY WITH MEALS 12/22/17   Chrismon, Marinda BRAVO, PA-C  Insulin  Glargine (LANTUS ) 100 UNIT/ML Solostar Pen Inject 60 Units into the skin 1 day or 1 dose. 02/11/14   [provider]  insulin  lispro (HUMALOG) 100  UNIT/ML injection Inject 5 Units into the skin 2 (two) times daily.    [provider]  lisinopril  (PRINIVIL ,ZESTRIL ) 30 MG tablet Take 1 tablet (30 mg total) by mouth daily. 03/11/17   Chrismon, Marinda BRAVO, PA-C  metFORMIN  (GLUCOPHAGE ) 1000 MG tablet Take 1,000 mg by mouth 2 (two) times daily with a meal.    [provider]  predniSONE  (DELTASONE ) 50 MG tablet Take 1 tablet (50 mg total) by mouth daily with breakfast. 12/19/17   Cuthriell, Jonathan D, PA-C  propranolol (INDERAL) 40 MG tablet Take 40 mg by mouth 2 (two) times daily. 10/02/20   [provider]  simvastatin  (ZOCOR ) 20 MG tablet Take 1 tablet (20 mg total) by mouth at bedtime. 12/10/16   Chrismon, Marinda BRAVO, PA-C  tamsulosin  (FLOMAX ) 0.4 MG CAPS capsule Take 1 capsule (0.4 mg total) by mouth daily. 12/12/20   Twylla Glendia BROCKS, MD    Physical Exam: Vitals:   07/15/24 0537 07/15/24 0730 07/15/24 0858 07/15/24 0945  BP: (!) 165/82 (!) 157/90 (!) 169/99 (!) 161/96  Pulse: 84 84 83 83  Resp: 18  17 18   Temp: 98.3 F (36.8 C)   98.2 F (36.8 C)  TempSrc: Oral     SpO2: 95% 92% 93% 96%  Weight:      Height:        Physical Exam Vitals reviewed.  HENT:     Head: Normocephalic.  Eyes:     Pupils: Pupils are equal, round, and reactive to light.  Cardiovascular:     Rate and Rhythm: Normal rate and regular rhythm.  Pulmonary:     Effort: Pulmonary effort is normal.     Breath sounds: Rales present.  Abdominal:     General: Bowel sounds are normal.  Musculoskeletal:     Right lower leg: Edema present.     Left lower leg: Edema present.  Skin:    General: Skin is warm and dry.     Capillary Refill: Capillary refill takes less than 2 seconds.  Neurological:     General: No focal deficit present.     Mental Status: He is alert and oriented to person, place, and time.     Motor: Weakness present.     Comments: Endorses bilateral lower extremity weakness  Psychiatric:        Mood and Affect: Mood  normal.        Behavior: Behavior normal.     Labs on Admission: I have personally reviewed following labs and imaging studies  CBC: Recent Labs  Lab 07/15/24 0244  WBC 6.5  HGB 11.2*  HCT 35.7*  MCV 82.6  PLT 283   Basic Metabolic Panel: Recent Labs  Lab 07/15/24 0244 07/15/24 0710  NA 138  --   K 3.3*  --   CL 102  --  CO2 26  --   GLUCOSE 233*  --   BUN 14  --   CREATININE 1.00  --   CALCIUM 8.7*  --   MG  --  1.7   GFR: Estimated Creatinine Clearance: 84 mL/min (by C-G formula based on SCr of 1 mg/dL). Liver Function Tests: No results for input(s): AST, ALT, ALKPHOS, BILITOT, PROT, ALBUMIN in the last 168 hours. No results for input(s): LIPASE, AMYLASE in the last 168 hours. No results for input(s): AMMONIA in the last 168 hours. Coagulation Profile: No results for input(s): INR, PROTIME in the last 168 hours. Cardiac Enzymes: No results for input(s): CKTOTAL, CKMB, CKMBINDEX, TROPONINI, TROPONINIHS in the last 168 hours. BNP (last 3 results) No results for input(s): BNP in the last 8760 hours. HbA1C: No results for input(s): HGBA1C in the last 72 hours. CBG: No results for input(s): GLUCAP in the last 168 hours. Lipid Profile: No results for input(s): CHOL, HDL, LDLCALC, TRIG, CHOLHDL, LDLDIRECT in the last 72 hours. Thyroid  Function Tests: No results for input(s): TSH, T4TOTAL, FREET4, T3FREE, THYROIDAB in the last 72 hours. Anemia Panel: No results for input(s): VITAMINB12, FOLATE, FERRITIN, TIBC, IRON, RETICCTPCT in the last 72 hours. Urine analysis:    Component Value Date/Time   COLORURINE Straw 03/04/2012 0748   APPEARANCEUR Clear 03/04/2012 0748   LABSPEC 1.029 03/04/2012 0748   PHURINE 5.0 03/04/2012 0748   GLUCOSEU >=500 03/04/2012 0748   HGBUR Negative 03/04/2012 0748   BILIRUBINUR neg 03/15/2016 1108   BILIRUBINUR Negative 03/04/2012 0748   KETONESUR Trace  03/04/2012 0748   PROTEINUR neg 03/15/2016 1108   PROTEINUR Negative 03/04/2012 0748   UROBILINOGEN negative 03/15/2016 1108   NITRITE neg 03/15/2016 1108   NITRITE Negative 03/04/2012 0748   LEUKOCYTESUR Negative 03/15/2016 1108   LEUKOCYTESUR Negative 03/04/2012 0748    Radiological Exams on Admission: I have personally reviewed images DG Chest 2 View Result Date: 07/15/2024 EXAM: 2 VIEW(S) XRAY OF THE CHEST 07/15/2024 02:46:00 AM COMPARISON: 11/10/2023 CLINICAL HISTORY: SOB FINDINGS: LUNGS AND PLEURA: Lungs demonstrate patchy opacities in the bases bilaterally. Central venous congestion with mild edema is noted. No sizable pleural effusion is seen. No pneumothorax. HEART AND MEDIASTINUM: Borderline cardiomegaly. BONES AND SOFT TISSUES: No acute osseous abnormality. IMPRESSION: 1. Patchy bibasilar opacities 2. Changes of mild chf Electronically signed by: Oneil Devonshire MD 07/15/2024 03:01 AM EST RP Workstation: [id number]    EKG: My personal interpretation of EKG shows: Normal sinus rhythm, HR 88    Assessment/Plan Principal Problem:   CHF (congestive heart failure) (HCC)    ##New onset congestive heart failure ##Elevated Troponin - IV Lasix  40 mg BID - ECHO - Strict I/Os - Check Lipid panel, HGB A1C, TSH - Trend troponin: 39--> 48--> - Start daily baby aspirin  - GDMT per cardiology - Cardiology consulted, appreciate their recommendations and management  ##Acute hypoxic respiratory failure, resolved - Supplemental O2 as needed  ##Normocytic anemia Patient denies signs of bleeding or bruising.  Denies melena, hematochezia - Check anemia panel  ##Hypokalemia - 40 mEq p.o. K x 2 ordered - Replete as needed  #Type 2 diabetes mellitus with peripheral neuropathy - Continue home 100 units Basaglar  nightly - AC/at bedtime SSI and CBG monitoring while inpatient  #Hypertension - Continue home amlodipine  for now  #Hyperlipidemia - Hold home pravastatin , patient reporting  bilateral lower extremity weakness   VTE prophylaxis:  Lovenox   GI prophylaxis: Not indicated  Diet: Heart healthy/carb modified with 1800 mL fluid restriction Access: PIV Lines:  None Code Status:  Full Code Telemetry: Yes  Admission status: Inpatient, Telemetry bed Patient is from: Home Anticipated d/c is to: Home Anticipated d/c date is: 1 to 2 days Patient currently: Receiving IV diuresis, undergoing workup for new onset CHF, and awaiting cardiology consultation   Family Communication: Mr. Gehret declined offer to call wife for update  Consults called: Dr. Marius Bathe with cardiology   Severity of Illness: The appropriate patient status for this patient is INPATIENT. Inpatient status is judged to be reasonable and necessary in order to provide the required intensity of service to ensure the patient's safety. The patient's presenting symptoms, physical exam findings, and initial radiographic and laboratory data in the context of their chronic comorbidities is felt to place them at high risk for further clinical deterioration. Furthermore, it is not anticipated that the patient will be medically stable for discharge from the hospital within 2 midnights of admission.   * I certify that at the point of admission it is my clinical judgment that the patient will require inpatient hospital care spanning beyond 2 midnights from the point of admission due to high intensity of service, high risk for further deterioration and high frequency of surveillance required.*  To reach the provider On-Call:   7AM- 7PM see care teams to locate the attending and reach out to them via [email or web address] Password: [email or web address] 7PM-7AM contact night-coverage If you still have difficulty reaching the appropriate provider, please page the [facility] (Director on Call) for [facility] on amion for assistance  This document was prepared using Conservation officer, historic buildings and may include unintentional dictation  errors.  Rockie Rams FNP-BC, PMHNP-BC Nurse Practitioner [facility]

## 2024-07-15 NOTE — Progress Notes (Signed)
 PHARMACIST - PHYSICIAN COMMUNICATION  CONCERNING:  Enoxaparin  (Lovenox ) for DVT Prophylaxis    RECOMMENDATION: Patient was prescribed enoxaprin 40mg  q24 hours for VTE prophylaxis.   Filed Weights   07/15/24 0233  Weight: 106.6 kg (235 lb)    Body mass index is 33.72 kg/m.  Estimated Creatinine Clearance: 84 mL/min (by C-G formula based on SCr of 1 mg/dL).   Based on [facility] policy patient is candidate for enoxaparin  0.5mg /kg TBW SQ every 24 hours based on BMI being >30.   DESCRIPTION: Pharmacy has adjusted enoxaparin  dose per [facility] policy.  Patient is now receiving enoxaparin  52.5 mg every 24 hours    Ransom Blanch PGY-1 Pharmacy Resident  [facility]  07/15/2024 8:21 AM

## 2024-07-15 NOTE — Consult Note (Addendum)
 [name] is a 68 y.o. male  [id number]  Primary Cardiologist: Denyse Bathe Reason for Consultation: Congestive heart failure  HPI: This is a 68 year old African-American male with a history of diabetes, hypertension, hyperlipidemia and peripheral neuropathy presented to the hospital with acute shortness of breath for the past 2 days.  Patient states that for the past 6 months he was having dyspnea on exertion and fatigue but last few days he had severe shortness of breath associated with tightness in the chest thus he came to the hospital.   Review of Systems: Patient does have tightness in the chest and also has PND and orthopnea   Past Medical History:  Diagnosis Date   Chest pain, unspecified    atypical   Diabetes mellitus    Hypertension    Nausea & vomiting    Shortness of breath     Medications Prior to Admission  Medication Sig Dispense Refill   albuterol  (PROVENTIL  HFA;VENTOLIN  HFA) 108 (90 Base) MCG/ACT inhaler INHALE 2 PUFFS BY MOUTH EVERY 6 HOURS AS NEEDED FOR WHEEZING OR SHORTNESS OF BREATH 18 g 0   amLODipine  (NORVASC ) 5 MG tablet Take 1 tablet by mouth daily.     Dulaglutide (TRULICITY) 4.5 MG/0.5ML SOAJ Inject 4.5 mg into the skin once a week.     gabapentin  (NEURONTIN ) 300 MG capsule Take 300 mg by mouth 2 (two) times daily.     Insulin  Glargine (BASAGLAR  KWIKPEN) 100 UNIT/ML Inject 100 Units into the skin at bedtime.     insulin  lispro (HUMALOG KWIKPEN) 100 UNIT/ML KwikPen Sliding scale. If above 150 inject 22 units. If over 200 inject 24 units. If over 250 inject 26 units. If over 300 inject 28 units. If over 350 inject 30 units. If over 400 call MD. For diabetes Subcutaneous once a day     metFORMIN  (GLUCOPHAGE ) 1000 MG tablet Take 1,000 mg by mouth 2 (two) times daily with a meal.     pravastatin  (PRAVACHOL ) 40 MG tablet Take 40 mg by mouth daily.     Continuous Glucose Sensor (FREESTYLE LIBRE 14 DAY SENSOR) MISC Use 1 device for continuous glucose  monitoring for diabetes     econazole nitrate 1 % cream 1 application 2 (two) times daily.     fluticasone (FLONASE) 50 MCG/ACT nasal spray SHAKE LIQUID AND USE 2 SPRAYS IN EACH NOSTRIL EVERY NIGHT AT BEDTIME (Patient not taking: Reported on 07/15/2024) 16 g 0   glipiZIDE (GLUCOTROL) 10 MG tablet TK 1 T PO BID (Patient not taking: Reported on 07/15/2024)  2   Glucose Blood (EXPRESS MED TEST STRIP PACK VI) 1 strip daily.     glucose blood test strip 1 strip 3 (three) times daily.     hydrochlorothiazide (HYDRODIURIL) 25 MG tablet  (Patient not taking: Reported on 07/15/2024)  11   ibuprofen  (ADVIL ,MOTRIN ) 800 MG tablet TAKE 1 TABLET(800 MG) BY MOUTH THREE TIMES DAILY WITH MEALS (Patient not taking: Reported on 07/15/2024) 30 tablet 0   Insulin  Glargine (LANTUS ) 100 UNIT/ML Solostar Pen Inject 60 Units into the skin 1 day or 1 dose. (Patient not taking: Reported on 07/15/2024)     insulin  lispro (HUMALOG) 100 UNIT/ML injection Inject 5 Units into the skin 2 (two) times daily. (Patient not taking: Reported on 07/15/2024)     lisinopril  (PRINIVIL ,ZESTRIL ) 30 MG tablet Take 1 tablet (30 mg total) by mouth daily. (Patient not taking: Reported on 07/15/2024) 90 tablet 2   predniSONE  (DELTASONE ) 50 MG tablet Take 1 tablet (  50 mg total) by mouth daily with breakfast. (Patient not taking: Reported on 07/15/2024) 5 tablet 0   propranolol (INDERAL) 40 MG tablet Take 40 mg by mouth 2 (two) times daily. (Patient not taking: Reported on 07/15/2024)     simvastatin  (ZOCOR ) 20 MG tablet Take 1 tablet (20 mg total) by mouth at bedtime. (Patient not taking: Reported on 07/15/2024) 90 tablet 3   simvastatin  (ZOCOR ) 40 MG tablet Take 1 tablet by mouth at bedtime. (Patient not taking: Reported on 07/15/2024)     tamsulosin  (FLOMAX ) 0.4 MG CAPS capsule Take 1 capsule (0.4 mg total) by mouth daily. (Patient not taking: Reported on 07/15/2024) 30 capsule 0      amLODipine   5 mg Oral Daily   aspirin  EC  81 mg Oral Daily    enoxaparin  (LOVENOX ) injection  0.5 mg/kg Subcutaneous Q24H   furosemide   40 mg Intravenous BID   gabapentin   300 mg Oral BID   insulin  aspart  0-20 Units Subcutaneous TID WC   insulin  aspart  0-5 Units Subcutaneous QHS   insulin  glargine-yfgn  100 Units Subcutaneous QHS   metFORMIN   1,000 mg Oral BID WC   potassium chloride   40 mEq Oral Q6H    Infusions:  magnesium  sulfate bolus IVPB 1 g (07/15/24 1252)    No Known Allergies  Social History   Socioeconomic History   Marital status: Married    Spouse name: Not on file   Number of children: Not on file   Years of education: Not on file   Highest education level: Not on file  Occupational History   Not on file  Tobacco Use   Smoking status: Never   Smokeless tobacco: Never  Vaping Use   Vaping status: Never Used  Substance and Sexual Activity   Alcohol use: No   Drug use: No   Sexual activity: Not on file  Other Topics Concern   Not on file  Social History Narrative   Not on file   Social Drivers of Health   Financial Resource Strain: Not on file  Food Insecurity: Not on file  Transportation Needs: Not on file  Physical Activity: Not on file  Stress: Not on file  Social Connections: Not on file  Intimate Partner Violence: Not on file    Family History  Problem Relation Age of Onset   Cancer Father    Diabetes Sister    Hypertension Sister    Heart attack Sister    Seizures Brother    Coronary artery disease Brother    Diabetes Brother    Hypertension Brother     PHYSICAL EXAM: Vitals:   07/15/24 0858 07/15/24 0945  BP: (!) 169/99 (!) 161/96  Pulse: 83 83  Resp: 17 18  Temp:  98.2 F (36.8 C)  SpO2: 93% 96%     Intake/Output Summary (Last 24 hours) at 07/15/2024 1258 Last data filed at 07/15/2024 0918 Gross per 24 hour  Intake --  Output 325 ml  Net -325 ml    General:  Well appearing. No respiratory difficulty HEENT: normal Neck: supple. no JVD. Carotids 2+ bilat; no bruits. No  lymphadenopathy or thryomegaly appreciated. Cor: PMI nondisplaced. Regular rate & rhythm. No rubs, gallops or murmurs. Lungs: clear Abdomen: soft, nontender, nondistended. No hepatosplenomegaly. No bruits or masses. Good bowel sounds. Extremities: no cyanosis, clubbing, rash, edema Neuro: alert & oriented x 3, cranial nerves grossly intact. moves all 4 extremities w/o difficulty. Affect pleasant.  ECG: Normal sinus rhythm nonspecific ST-T  changes  Results for orders placed or performed during the hospital encounter of 07/15/24 (from the past 24 hours)  Resp panel by RT-PCR (RSV, Flu A&B, Covid) Anterior Nasal Swab     Status: None   Collection Time: 07/15/24  2:39 AM   Specimen: Anterior Nasal Swab  Result Value Ref Range   SARS Coronavirus 2 by RT PCR NEGATIVE NEGATIVE   Influenza A by PCR NEGATIVE NEGATIVE   Influenza B by PCR NEGATIVE NEGATIVE   Resp Syncytial Virus by PCR NEGATIVE NEGATIVE  Basic metabolic panel     Status: Abnormal   Collection Time: 07/15/24  2:44 AM  Result Value Ref Range   Sodium 138 135 - 145 mmol/L   Potassium 3.3 (L) 3.5 - 5.1 mmol/L   Chloride 102 98 - 111 mmol/L   CO2 26 22 - 32 mmol/L   Glucose, Bld 233 (H) 70 - 99 mg/dL   BUN 14 8 - 23 mg/dL   Creatinine, Ser 8.99 0.61 - 1.24 mg/dL   Calcium 8.7 (L) 8.9 - 10.3 mg/dL   GFR, Estimated >39 >39 mL/min   Anion gap 11 5 - 15  CBC     Status: Abnormal   Collection Time: 07/15/24  2:44 AM  Result Value Ref Range   WBC 6.5 4.0 - 10.5 K/uL   RBC 4.32 4.22 - 5.81 MIL/uL   Hemoglobin 11.2 (L) 13.0 - 17.0 g/dL   HCT 64.2 (L) 60.9 - 47.9 %   MCV 82.6 80.0 - 100.0 fL   MCH 25.9 (L) 26.0 - 34.0 pg   MCHC 31.4 30.0 - 36.0 g/dL   RDW 85.3 88.4 - 84.4 %   Platelets 283 150 - 400 K/uL   nRBC 0.0 0.0 - 0.2 %  Pro Brain natriuretic peptide     Status: None   Collection Time: 07/15/24  2:44 AM  Result Value Ref Range   Pro Brain Natriuretic Peptide 152.0 <300.0 pg/mL  Troponin T, High Sensitivity     Status:  Abnormal   Collection Time: 07/15/24  2:44 AM  Result Value Ref Range   Troponin T High Sensitivity 39 (H) 0 - 19 ng/L  Procalcitonin     Status: None   Collection Time: 07/15/24  2:44 AM  Result Value Ref Range   Procalcitonin <0.10 ng/mL  Hemoglobin A1c     Status: Abnormal   Collection Time: 07/15/24  2:44 AM  Result Value Ref Range   Hgb A1c MFr Bld 9.2 (H) 4.8 - 5.6 %   Mean Plasma Glucose 217.34 mg/dL  Troponin T, High Sensitivity     Status: Abnormal   Collection Time: 07/15/24  7:10 AM  Result Value Ref Range   Troponin T High Sensitivity 48 (H) 0 - 19 ng/L  Magnesium      Status: None   Collection Time: 07/15/24  7:10 AM  Result Value Ref Range   Magnesium  1.7 1.7 - 2.4 mg/dL  Troponin T, High Sensitivity     Status: Abnormal   Collection Time: 07/15/24  9:53 AM  Result Value Ref Range   Troponin T High Sensitivity 48 (H) 0 - 19 ng/L  Lipid panel     Status: Abnormal   Collection Time: 07/15/24  9:53 AM  Result Value Ref Range   Cholesterol 170 0 - 200 mg/dL   Triglycerides 84 <849 mg/dL   HDL 53 >59 mg/dL   Total CHOL/HDL Ratio 3.2 RATIO   VLDL 17 0 - 40 mg/dL   LDL  Cholesterol 100 (H) 0 - 99 mg/dL  TSH     Status: None   Collection Time: 07/15/24  9:53 AM  Result Value Ref Range   TSH 1.270 0.350 - 4.500 uIU/mL   DG Chest 2 View Result Date: 07/15/2024 EXAM: 2 VIEW(S) XRAY OF THE CHEST 07/15/2024 02:46:00 AM COMPARISON: 11/10/2023 CLINICAL HISTORY: SOB FINDINGS: LUNGS AND PLEURA: Lungs demonstrate patchy opacities in the bases bilaterally. Central venous congestion with mild edema is noted. No sizable pleural effusion is seen. No pneumothorax. HEART AND MEDIASTINUM: Borderline cardiomegaly. BONES AND SOFT TISSUES: No acute osseous abnormality. IMPRESSION: 1. Patchy bibasilar opacities 2. Changes of mild chf Electronically signed by: Oneil Devonshire MD 07/15/2024 03:01 AM EST RP Workstation: [id number]     ASSESSMENT AND PLAN: #1 congestive heart failure due to  hypertensive heart disease.  Echo had normal LVEF, 57%.Agree with  starting lasix . Uncontrolled hypertension, Will start the patient on losartan  50 mg daily, Aldactone  25 mg p.o. daily, and Farxiga  10 mg daily. #2 hypokalemia, advise giving 40 equivalent of KCl and just started Aldactone  which showed improved hypokalemia.  Thank you very much for referral. Denyse Bathe

## 2024-07-15 NOTE — ED Notes (Signed)
 After breathing treatment, pt is now 100% on room air with decreased work of breathing and no audible adventitious lung sounds. Transported to X-ray

## 2024-07-15 NOTE — ED Provider Notes (Signed)
 [facility] Provider Note    Event Date/Time   First MD Initiated Contact with Patient 07/15/24 0259     (approximate)   History   Shortness of Breath   HPI  [name] is a 68 y.o. male who presents to the emergency department today because concerns for shortness of breath.  Patient has been feeling somewhat short of breath for the past couple of days.  Got worse tonight.  Additionally has noticed swelling in his lower extremities.  The patient denies similar symptoms in the past.  States that he was going to be worked up for heart issues as an outpatient but has not yet had that appointment.  He denies any fevers.     Physical Exam   Triage Vital Signs: ED Triage Vitals  Encounter Vitals Group     BP 07/15/24 0237 (!) 171/105     Girls Systolic BP Percentile --      Girls Diastolic BP Percentile --      Boys Systolic BP Percentile --      Boys Diastolic BP Percentile --      Pulse Rate 07/15/24 0237 100     Resp 07/15/24 0237 (!) 26     Temp 07/15/24 0237 98.3 F (36.8 C)     Temp Source 07/15/24 0237 Oral     SpO2 07/15/24 0237 (!) 86 %     Weight 07/15/24 0233 235 lb (106.6 kg)     Height 07/15/24 0233 5' 10 (1.778 m)     Head Circumference --      Peak Flow --      Pain Score 07/15/24 0233 0     Pain Loc --      Pain Education --      Exclude from Growth Chart --     Most recent vital signs: Vitals:   07/15/24 0237 07/15/24 0405  BP: (!) 171/105   Pulse: 100 85  Resp: (!) 26   Temp: 98.3 F (36.8 C)   SpO2: (!) 86% 93%   General: Awake, alert, oriented. CV:  Good peripheral perfusion. Regular rate and rhythm. Resp:  Slightly increased work of breathing. Mild expiratory wheezing. Abd:  No distention.  Other:  Bilateral lower extremity edema.    ED Results / Procedures / Treatments   Labs (all labs ordered are listed, but only abnormal results are displayed) Labs Reviewed  BASIC METABOLIC PANEL WITH GFR - Abnormal;  Notable for the following components:      Result Value   Potassium 3.3 (*)    Glucose, Bld 233 (*)    Calcium 8.7 (*)    All other components within normal limits  CBC - Abnormal; Notable for the following components:   Hemoglobin 11.2 (*)    HCT 35.7 (*)    MCH 25.9 (*)    All other components within normal limits  TROPONIN T, HIGH SENSITIVITY - Abnormal; Notable for the following components:   Troponin T High Sensitivity 39 (*)    All other components within normal limits  TROPONIN T, HIGH SENSITIVITY - Abnormal; Notable for the following components:   Troponin T High Sensitivity 48 (*)    All other components within normal limits  RESP PANEL BY RT-PCR (RSV, FLU A&B, COVID)  RVPGX2  PRO BRAIN NATRIURETIC PEPTIDE  PROCALCITONIN     EKG  I, Guadalupe Eagles, attending physician, personally viewed and interpreted this EKG  EKG Time: 0252 Rate: 88 Rhythm: normal sinus rhythm  Axis: normal Intervals: qtc 438 QRS: narrow, LVH ST changes: no st elevation Impression: abnormal ekg    RADIOLOGY I independently interpreted and visualized the CXR. My interpretation: [facility] interpretation:  IMPRESSION:  1. Patchy bibasilar opacities  2. Changes of mild chf      PROCEDURES:  Critical Care performed: Yes  CRITICAL CARE Performed by: Guadalupe Eagles   Total critical care time: 30 minutes  Critical care time was exclusive of separately billable procedures and treating other patients.  Critical care was necessary to treat or prevent imminent or life-threatening deterioration.  Critical care was time spent personally by me on the following activities: development of treatment plan with patient and/or surrogate as well as nursing, discussions with consultants, evaluation of patient's response to treatment, examination of patient, obtaining history from patient or surrogate, ordering and performing treatments and interventions, ordering and review of laboratory  studies, ordering and review of radiographic studies, pulse oximetry and re-evaluation of patient's condition.   Procedures    MEDICATIONS ORDERED IN ED: Medications  ipratropium-albuterol  (DUONEB) 0.5-2.5 (3) MG/3ML nebulizer solution 3 mL (3 mLs Nebulization Given 07/15/24 0238)     IMPRESSION / MDM / ASSESSMENT AND PLAN / ED COURSE  I reviewed the triage vital signs and the nursing notes.                              Differential diagnosis includes, but is not limited to, chf, pneumonia, viral illness, anemia  Patient's presentation is most consistent with acute presentation with potential threat to life or bodily function.   The patient is on the cardiac monitor to evaluate for evidence of arrhythmia and/or significant heart rate changes.  Patient presented to the emergency department today because of concerns for shortness of breath.  Upon arrival to the emergency department the patient was found to be hypoxic.  He does state he has also been having some swelling and it does have bilateral lower extremity edema.  Chest x-ray with bibasilar opacities and changes consistent with CHF.  Patient afebrile without any leukocytosis.  At this time I have low concern for infection.  Did check procalcitonin which was also negative.  Patient was given IV Lasix  and did have good output and stated that he did feel better.  However initial troponin was slightly elevated and had further elevation on repeat.  Given concern for new CHF and elevated troponin I do feel he would benefit from further workup and evaluation.  Will discuss with the hospitalist for admission.      FINAL CLINICAL IMPRESSION(S) / ED DIAGNOSES   Final diagnoses:  Congestive heart failure, unspecified HF chronicity, unspecified heart failure type (HCC)  Elevated troponin        Note:  This document was prepared using Dragon voice recognition software and may include unintentional dictation errors.    Eagles Guadalupe, MD 07/15/24 [phone]

## 2024-07-15 NOTE — ED Triage Notes (Signed)
 Pt presents for Shortness of breath that woke him from sleep. Denies known fevers, chills, cough or recent illness. Audibly wheezing and hypoxic (86%) on room air. Endorsing ankle swelling and weight gain (approx 20 lbs).   Past Medical History:  Diagnosis Date   Chest pain, unspecified    atypical   Diabetes mellitus    Hypertension    Nausea & vomiting    Shortness of breath

## 2024-07-15 NOTE — ED Notes (Signed)
 Advised nurse that patient has ready bed

## 2024-07-15 NOTE — Progress Notes (Signed)
  Echocardiogram 2D Echocardiogram has been performed.  [name] 07/15/2024, 2:27 PM

## 2024-07-15 NOTE — Plan of Care (Signed)
  Problem: Education: Goal: Ability to demonstrate management of disease process will improve Outcome: Progressing Goal: Ability to verbalize understanding of medication therapies will improve Outcome: Progressing   

## 2024-07-16 ENCOUNTER — Other Ambulatory Visit (HOSPITAL_COMMUNITY): Payer: Self-pay

## 2024-07-16 ENCOUNTER — Telehealth (HOSPITAL_COMMUNITY): Payer: Self-pay

## 2024-07-16 DIAGNOSIS — E876 Hypokalemia: Secondary | ICD-10-CM

## 2024-07-16 DIAGNOSIS — I5031 Acute diastolic (congestive) heart failure: Secondary | ICD-10-CM | POA: Diagnosis not present

## 2024-07-16 DIAGNOSIS — E1100 Type 2 diabetes mellitus with hyperosmolarity without nonketotic hyperglycemic-hyperosmolar coma (NKHHC): Secondary | ICD-10-CM | POA: Diagnosis not present

## 2024-07-16 DIAGNOSIS — D649 Anemia, unspecified: Secondary | ICD-10-CM | POA: Diagnosis not present

## 2024-07-16 LAB — BASIC METABOLIC PANEL WITH GFR
Anion gap: 9 (ref 5–15)
BUN: 19 mg/dL (ref 8–23)
CO2: 28 mmol/L (ref 22–32)
Calcium: 9.7 mg/dL (ref 8.9–10.3)
Chloride: 106 mmol/L (ref 98–111)
Creatinine, Ser: 1.18 mg/dL (ref 0.61–1.24)
GFR, Estimated: >60 mL/min (ref >60)
Glucose, Bld: 104 mg/dL — ABNORMAL HIGH (ref 70–99)
Potassium: 3.5 mmol/L (ref 3.5–5.1)
Sodium: 144 mmol/L (ref 135–145)

## 2024-07-16 LAB — RETICULOCYTES
Immature Retic Fract: 10.3 % (ref 2.3–15.9)
RBC.: 4.5 MIL/uL (ref 4.22–5.81)
Retic Count, Absolute: 55.4 K/uL (ref 19.0–186.0)
Retic Ct Pct: 1.2 % (ref 0.4–3.1)

## 2024-07-16 LAB — CBC
HCT: 37.3 % — ABNORMAL LOW (ref 39.0–52.0)
Hemoglobin: 11.7 g/dL — ABNORMAL LOW (ref 13.0–17.0)
MCH: 25.4 pg — ABNORMAL LOW (ref 26.0–34.0)
MCHC: 31.4 g/dL (ref 30.0–36.0)
MCV: 80.9 fL (ref 80.0–100.0)
Platelets: 329 K/uL (ref 150–400)
RBC: 4.61 MIL/uL (ref 4.22–5.81)
RDW: 14.9 % (ref 11.5–15.5)
WBC: 5.9 K/uL (ref 4.0–10.5)
nRBC: 0 % (ref 0.0–0.2)

## 2024-07-16 LAB — IRON AND TIBC
Iron: 27 ug/dL — ABNORMAL LOW (ref 45–182)
Saturation Ratios: 6 % — ABNORMAL LOW (ref 17.9–39.5)
TIBC: 424 ug/dL (ref 250–450)
UIBC: 398 ug/dL

## 2024-07-16 LAB — GLUCOSE, CAPILLARY
Glucose-Capillary: 114 mg/dL — ABNORMAL HIGH (ref 70–99)
Glucose-Capillary: 151 mg/dL — ABNORMAL HIGH (ref 70–99)
Glucose-Capillary: 126 mg/dL — ABNORMAL HIGH (ref 70–99)
Glucose-Capillary: 220 mg/dL — ABNORMAL HIGH (ref 70–99)

## 2024-07-16 LAB — FERRITIN: Ferritin: 13 ng/mL — ABNORMAL LOW (ref 24–336)

## 2024-07-16 LAB — MAGNESIUM: Magnesium: 2.2 mg/dL (ref 1.7–2.4)

## 2024-07-16 LAB — VITAMIN B12: Vitamin B-12: 466 pg/mL (ref 180–914)

## 2024-07-16 LAB — FOLATE: Folate: 10.6 ng/mL (ref >5.9)

## 2024-07-16 MED ORDER — SPIRONOLACTONE 25 MG PO TABS
25.0000 mg | ORAL_TABLET | Freq: Every day | ORAL | Status: DC
  Administered 2024-07-16 – 2024-07-17 (×2): 25 mg via ORAL
  Filled 2024-07-16 (×2): qty 1

## 2024-07-16 MED ORDER — PRAVASTATIN SODIUM 20 MG PO TABS
40.0000 mg | ORAL_TABLET | Freq: Every day | ORAL | Status: DC
  Administered 2024-07-16: 40 mg via ORAL
  Filled 2024-07-16: qty 1

## 2024-07-16 NOTE — Plan of Care (Signed)
?  Problem: Activity: ?Goal: Capacity to carry out activities will improve ?Outcome: Progressing ?  ?

## 2024-07-16 NOTE — Progress Notes (Signed)
 Heart Failure Stewardship Pharmacy Note  PCP: Entzminger, Ethridge LABOR, MD PCP-Cardiologist: None  HPI: [name] is a 68 y.o. 68 y.o. male with T2DM, peripheral neuropathy, HTN, HLD, who presented with progressive shortness of breath over the past 6 months along with fatigue and orthopnea. On admission, proBNP was 152, HS-troponin was 39, TSAT was 9, ferritin was 13, and A1c was 9.2. Chest x-ray noted mild CHF changes. TTE noted LVEF 60-65%, moderate concentric LVH, mild MR, LV IVS 1.4 cm.  Pertinent Lab Values: Creatinine  Date Value Ref Range Status  12/07/2012 1.23 0.60 - 1.30 mg/dL Final   Creatinine, Ser  Date Value Ref Range Status  07/16/2024 1.18 0.61 - 1.24 mg/dL Final   BUN  Date Value Ref Range Status  07/16/2024 19 8 - 23 mg/dL Final  95/80/7981 20 8 - 27 mg/dL Final  95/82/7985 12 7 - 18 mg/dL Final   Potassium  Date Value Ref Range Status  07/16/2024 3.5 3.5 - 5.1 mmol/L Final  12/07/2012 3.6 3.5 - 5.1 mmol/L Final   Sodium  Date Value Ref Range Status  07/16/2024 144 135 - 145 mmol/L Final  12/09/2016 139 134 - 144 mmol/L Final  12/07/2012 136 136 - 145 mmol/L Final   B Natriuretic Peptide  Date Value Ref Range Status  12/21/2020 33.0 0.0 - 100.0 pg/mL Final    Comment:    Performed at [facility], [address] [place], [place] [address]   Magnesium   Date Value Ref Range Status  07/16/2024 2.2 1.7 - 2.4 mg/dL Final    Comment:    Performed at [facility], [address] [place], [place] [address]   Hemoglobin A1C  Date Value Ref Range Status  05/02/2017 9.0  Final   Hgb A1c MFr Bld  Date Value Ref Range Status  07/15/2024 9.2 (H) 4.8 - 5.6 % Final    Comment:    (NOTE) Diagnosis of Diabetes The following HbA1c ranges recommended by the American Diabetes Association (ADA) may be used as an aid in the diagnosis of diabetes mellitus.  Hemoglobin             Suggested A1C NGSP%              Diagnosis  <5.7                    Non Diabetic  5.7-6.4                Pre-Diabetic  >6.4                   Diabetic  <7.0                   Glycemic control for                       adults with diabetes.     TSH  Date Value Ref Range Status  07/15/2024 1.270 0.350 - 4.500 uIU/mL Final    Comment:    Performed at [facility], [address] [place], [place] [address]  12/09/2016 1.460 0.450 - 4.500 uIU/mL Final    Vital Signs:  Temp:  [97.9 F (36.6 C)-98.5 F (36.9 C)] 98.4 F (36.9 C) (11/24 0816) Pulse Rate:  [81-96] 81 (11/24 0816) Cardiac Rhythm: Normal sinus rhythm;Bundle branch block (11/23 1900) Resp:  [16-20] 16 (11/24 0816) BP: (129-169)/(62-99) 129/88 (11/24 0816) SpO2:  [93 %-98 %] 95 % (11/24 0816) Weight:  [  102.6 kg (226 lb 3.1 oz)] 102.6 kg (226 lb 3.1 oz) (11/24 0500)  Intake/Output Summary (Last 24 hours) at 07/16/2024 0839 Last data filed at 07/15/2024 1700 Gross per 24 hour  Intake --  Output 1225 ml  Net -1225 ml    Current Heart Failure Medications:  Loop diuretic: furosemide  40 mg IV BID Beta-Blocker: none ACEI/ARB/ARNI: losartan  50 mg daily MRA: spironolactone  12.5 mg daily SGLT2i: Farxiga  10 mg daily Other: amlodipine  5 mg daily  Prior to admission Heart Failure Medications:  Taking no CHF medications PTA  Assessment: 1. Acute on chronic diastolic heart failure (LVEF 60-65%)  , due to unknown etiology. NYHA class II symptoms.  -Symptoms: Reports feeling much improved. Denies shortness of breath, orthopnea, and fatigue. Does have some LEE. Reports appetite is fine aside from the bland food.  -Volume: Patient does not appear significantly volume overloaded. proBNP was not elevated on admission, CXR noted only mild CHF, and patient is feeling better. Would continue IV furosemide  this morning and can consider oral furosemide  20 mg daily vs prn only at discharge. -Hemodynamics: BP is elevated. HR 80s. -SGLT2i: Continue Farxiga  10 mg daily. -MRA: Agree with  adding spironolactone  25 mg daily. -ARNI: Can consider transitioning from losartan  to Entresto 24/26 mg BID.  Plan: 1) Medication changes recommended at this time: - Consider transitioning from losartan  to Entresto 24-26 mg BID tomorrow pending copay   2) 70 Patient assistance: -Pending  3) Education: - Patient has been educated on current HF medications and potential additions to HF medication regimen - Patient verbalizes understanding that over the next few months, these medication doses may change and more medications may be added to optimize HF regimen - Patient has been educated on basic disease state pathophysiology and goals of therapy  Medication Assistance / Insurance Benefits Check: Does the patient have prescription insurance?    Please do not hesitate to reach out with questions or concerns,  Jaun Bash, PharmD, CPP, BCPS, [facility] Heart Failure Pharmacist  Phone - [phone] 07/16/2024 10:35 AM

## 2024-07-16 NOTE — Telephone Encounter (Signed)
 Pharmacy Patient Advocate Encounter  Insurance verification completed.    The patient is insured through [facility]. Patient has Medicare and is not eligible for a copay card, but may be able to apply for patient assistance or Medicare RX Payment Plan (Patient Must reach out to their plan, if eligible for payment plan), if available.    Ran test claim for Brand Entresto 24-26mg  and the current 30 day co-pay is $302.  Ran test claim for Farxiga  10mg  and the current 30 day co-pay is $302.  Ran test claim for Jardiance 10mg  and the current 30 day co-pay is $302.   This test claim was processed through [facility]- copay amounts may vary at other pharmacies due to pharmacy/plan contracts, or as the patient moves through the different stages of their insurance plan.

## 2024-07-16 NOTE — Progress Notes (Signed)
 PROGRESS NOTE    [name]  FMW:[id number] DOB: 04-14-1956 DOA: 07/15/2024 PCP: Sampson Ethridge LABOR, MD   Brief Narrative:   68 y.o. year old male with past medical history of type 2 diabetes mellitus with peripheral neuropathy, hypertension, hyperlipidemia, and vitiligo.  He presents to [facility] regional ED with reports of dyspnea.  This has been ongoing for about 6 months however he noted it to be particularly worse over the last couple of days and further worsened last night prompting him to present to the ED.  He reports that over the last 6 months he notices that he is dyspneic and fatigue when walking and often has to rest or take a breath after coming down the steps or prior to going back up the steps.   11/24: cardio c/s, DC tele, transfer to med-surg   Assessment & Plan:   Principal Problem:   CHF (congestive heart failure) (HCC) Active Problems:   Type II diabetes mellitus (HCC)   HTN (hypertension)   Hypercholesterolemia   Elevated troponin   Acute hypoxic respiratory failure (HCC)   Normocytic anemia   Hypokalemia   ##New onset congestive heart failure ##Elevated Troponin - continue V Lasix  40 mg BID and plan for change to PO tomorrow at DC - Echo shows normal LV function - Continue Farxiga  10 mg daily. - adding spironolactone  25 mg daily. - change losartan  to Entresto 24/26 mg BID tomorrow at DC - neg serial troponin - Continue daily baby aspirin  - Cardiology seen   ##Acute hypoxic respiratory failure, resolved - on RA   ##Normocytic anemia Patient denies signs of bleeding or bruising.  Denies melena, hematochezia   ##Hypokalemia - repleted   #Type 2 diabetes mellitus with peripheral neuropathy - Continue home 100 units semglee  nightly - AC/at bedtime SSI and CBG monitoring while inpatient   #Hypertension - Continue home amlodipine , added spironolactone  and losartan    #Hyperlipidemia - Cardio started Pravastatin       DVT prophylaxis:  Lovenox       Code Status: (Full code Family Communication: wife updated at bedside Disposition Plan: possible DC tomorrow depending on clinical condition and cardio clearance/work up   Consultants:  Cardio    Subjective:  Sitting in the chair, breathing improving, wife at bedside.  Objective: Vitals:   07/16/24 0816 07/16/24 1247 07/16/24 1604 07/16/24 2055  BP: 129/88 (!) 143/85 124/76 134/82  Pulse: 81 93 82 94  Resp: 16 16 16 20   Temp: 98.4 F (36.9 C) 98 F (36.7 C) 98 F (36.7 C) 98.3 F (36.8 C)  TempSrc:      SpO2: 95% 97% 97% 95%  Weight:      Height:        Intake/Output Summary (Last 24 hours) at 07/16/2024 2128 Last data filed at 07/16/2024 1907 Gross per 24 hour  Intake 480 ml  Output --  Net 480 ml   Filed Weights   07/15/24 0233 07/16/24 0500  Weight: 106.6 kg 102.6 kg    Examination:  General exam: Appears calm and comfortable  Respiratory system: Clear to auscultation. Respiratory effort normal. Cardiovascular system: S1 & S2 heard, RRR. No  murmurs, 1 + pedal edema. Gastrointestinal system: Abdomen is soft, benign Central nervous system: Alert and oriented. No focal neurological deficits. Extremities: Symmetric 5 x 5 power. Skin: No rashes, lesions or ulcers Psychiatry: Judgement and insight appear normal. Mood & affect appropriate.     Data Reviewed: I have personally reviewed following labs and imaging studies  CBC: Recent  Labs  Lab 07/15/24 0244 07/16/24 0458  WBC 6.5 5.9  HGB 11.2* 11.7*  HCT 35.7* 37.3*  MCV 82.6 80.9  PLT 283 329   Basic Metabolic Panel: Recent Labs  Lab 07/15/24 0244 07/15/24 0710 07/16/24 0458  NA 138  --  144  K 3.3*  --  3.5  CL 102  --  106  CO2 26  --  28  GLUCOSE 233*  --  104*  BUN 14  --  19  CREATININE 1.00  --  1.18  CALCIUM 8.7*  --  9.7  MG  --  1.7 2.2    BNP (last 3 results) Recent Labs    07/15/24 0244  PROBNP 152.0   HbA1C: Recent Labs    07/15/24 0244  HGBA1C  9.2*   CBG: Recent Labs  Lab 07/15/24 1751 07/15/24 2108 07/16/24 0817 07/16/24 1153 07/16/24 1605  GLUCAP 171* 255* 114* 151* 126*   Lipid Profile: Recent Labs    07/15/24 0953  CHOL 170  HDL 53  LDLCALC 100*  TRIG 84  CHOLHDL 3.2   Thyroid  Function Tests: Recent Labs    07/15/24 0953  TSH 1.270   Anemia Panel: Recent Labs    07/16/24 0458  VITAMINB12 466  FOLATE 10.6  FERRITIN 13*  TIBC 424  IRON 27*  RETICCTPCT 1.2   Sepsis Labs: Recent Labs  Lab 07/15/24 0244  PROCALCITON <0.10    Recent Results (from the past 240 hours)  Resp panel by RT-PCR (RSV, Flu A&B, Covid) Anterior Nasal Swab     Status: None   Collection Time: 07/15/24  2:39 AM   Specimen: Anterior Nasal Swab  Result Value Ref Range Status   SARS Coronavirus 2 by RT PCR NEGATIVE NEGATIVE Final    Comment: (NOTE) SARS-CoV-2 target nucleic acids are NOT DETECTED.  The SARS-CoV-2 RNA is generally detectable in upper respiratory specimens during the acute phase of infection. The lowest concentration of SARS-CoV-2 viral copies this assay can detect is 138 copies/mL. A negative result does not preclude SARS-Cov-2 infection and should not be used as the sole basis for treatment or other patient management decisions. A negative result may occur with  improper specimen collection/handling, submission of specimen other than nasopharyngeal swab, presence of viral mutation(s) within the areas targeted by this assay, and inadequate number of viral copies(<138 copies/mL). A negative result must be combined with clinical observations, patient history, and epidemiological information. The expected result is Negative.  Fact Sheet for Patients:  bloggercourse.com  Fact Sheet for Healthcare Providers:  seriousbroker.it  This test is no t yet approved or cleared by the [place]  FDA and  has been authorized for detection and/or diagnosis of  SARS-CoV-2 by FDA under an Emergency Use Authorization (EUA). This EUA will remain  in effect (meaning this test can be used) for the duration of the COVID-19 declaration under Section 564(b)(1) of the Act, 21 U.S.C.section 360bbb-3(b)(1), unless the authorization is terminated  or revoked sooner.       Influenza A by PCR NEGATIVE NEGATIVE Final   Influenza B by PCR NEGATIVE NEGATIVE Final    Comment: (NOTE) The Xpert Xpress SARS-CoV-2/FLU/RSV plus assay is intended as an aid in the diagnosis of influenza from Nasopharyngeal swab specimens and should not be used as a sole basis for treatment. Nasal washings and aspirates are unacceptable for Xpert Xpress SARS-CoV-2/FLU/RSV testing.  Fact Sheet for Patients: bloggercourse.com  Fact Sheet for Healthcare Providers: seriousbroker.it  This test is not yet approved  or cleared by the [place]  FDA and has been authorized for detection and/or diagnosis of SARS-CoV-2 by FDA under an Emergency Use Authorization (EUA). This EUA will remain in effect (meaning this test can be used) for the duration of the COVID-19 declaration under Section 564(b)(1) of the Act, 21 U.S.C. section 360bbb-3(b)(1), unless the authorization is terminated or revoked.     Resp Syncytial Virus by PCR NEGATIVE NEGATIVE Final    Comment: (NOTE) Fact Sheet for Patients: bloggercourse.com  Fact Sheet for Healthcare Providers: seriousbroker.it  This test is not yet approved or cleared by the [place]  FDA and has been authorized for detection and/or diagnosis of SARS-CoV-2 by FDA under an Emergency Use Authorization (EUA). This EUA will remain in effect (meaning this test can be used) for the duration of the COVID-19 declaration under Section 564(b)(1) of the Act, 21 U.S.C. section 360bbb-3(b)(1), unless the authorization is terminated  or revoked.  Performed at [facility], [address] [place], [place] [address]          Radiology Studies: ECHOCARDIOGRAM COMPLETE Result Date: 07/15/2024    ECHOCARDIOGRAM REPORT   Patient Name:   [name] Date of Exam: 07/15/2024 Medical Rec #:  [id number]      Height:       70.0 in Accession #:    [id number]     Weight:       235.0 lb Date of Birth:  [birth year]      BSA:          2.235 m Patient Age:    68 years       BP:           161/96 mmHg Patient Gender: M              HR:           82 bpm. Exam Location:  [facility] Procedure: 2D Echo, Cardiac Doppler, Color Doppler and Strain Analysis (Both            Spectral and Color Flow Doppler were utilized during procedure). Indications:     CHF I50.9  History:         Patient has no prior history of Echocardiogram examinations.  Sonographer:     Thedora Louder RDCS, FASE Referring Phys:  [id number] ROCKIE CROME FOUST Diagnosing Phys: Denyse Bathe  Sonographer Comments: Global longitudinal strain was attempted. IMPRESSIONS  1. Left ventricular ejection fraction, by estimation, is 60 to 65%. The left ventricle has normal function. The left ventricle has no regional wall motion abnormalities. There is moderate concentric left ventricular hypertrophy. Left ventricular diastolic parameters were normal.  2. Right ventricular systolic function is normal. The right ventricular size is normal.  3. The mitral valve is normal in structure. Mild mitral valve regurgitation. No evidence of mitral stenosis.  4. The aortic valve is normal in structure. Aortic valve regurgitation is mild. No aortic stenosis is present.  5. The inferior vena cava is normal in size with greater than 50% respiratory variability, suggesting right atrial pressure of 3 mmHg. FINDINGS  Left Ventricle: Left ventricular ejection fraction, by estimation, is 60 to 65%. The left ventricle has normal function. The left ventricle has no regional wall motion abnormalities. Strain was  performed and the global longitudinal strain is indeterminate. The left ventricular internal cavity size was normal in size. There is moderate concentric left ventricular hypertrophy. Left ventricular diastolic parameters were normal. Right Ventricle: The right ventricular size is normal. No increase in right  ventricular wall thickness. Right ventricular systolic function is normal. Left Atrium: Left atrial size was normal in size. Right Atrium: Right atrial size was normal in size. Pericardium: There is no evidence of pericardial effusion. Mitral Valve: The mitral valve is normal in structure. Mild mitral valve regurgitation. No evidence of mitral valve stenosis. Tricuspid Valve: The tricuspid valve is normal in structure. Tricuspid valve regurgitation is mild . No evidence of tricuspid stenosis. Aortic Valve: The aortic valve is normal in structure. Aortic valve regurgitation is mild. No aortic stenosis is present. Aortic valve mean gradient measures 10.0 mmHg. Aortic valve peak gradient measures 18.2 mmHg. Aortic valve area, by VTI measures 1.74 cm. Pulmonic Valve: The pulmonic valve was normal in structure. Pulmonic valve regurgitation is mild. No evidence of pulmonic stenosis. Aorta: The aortic root is normal in size and structure. Venous: The inferior vena cava is normal in size with greater than 50% respiratory variability, suggesting right atrial pressure of 3 mmHg. IAS/Shunts: No atrial level shunt detected by color flow Doppler. Additional Comments: 3D was performed not requiring image post processing on an independent workstation and was normal.  LEFT VENTRICLE PLAX 2D LVIDd:         4.30 cm   Diastology LVIDs:         3.00 cm   LV e' medial:    9.36 cm/s LV PW:         1.70 cm   LV E/e' medial:  9.2 LV IVS:        1.40 cm   LV e' lateral:   8.81 cm/s LVOT diam:     2.10 cm   LV E/e' lateral: 9.8 LV SV:         72 LV SV Index:   32 LVOT Area:     3.46 cm LV IVRT:       71 msec  RIGHT VENTRICLE RV Basal  diam:  2.70 cm     PULMONARY VEINS RV S prime:     22.00 cm/s  Diastolic Velocity: 58.60 cm/s TAPSE (M-mode): 2.4 cm      S/D Velocity:       1.20                             Systolic Velocity:  71.30 cm/s LEFT ATRIUM             Index        RIGHT ATRIUM           Index LA diam:        3.90 cm 1.74 cm/m   RA Area:     19.50 cm LA Vol (A2C):   65.8 ml 29.44 ml/m  RA Volume:   50.30 ml  22.50 ml/m LA Vol (A4C):   60.5 ml 27.06 ml/m LA Biplane Vol: 63.7 ml 28.50 ml/m  AORTIC VALVE                     PULMONIC VALVE AV Area (Vmax):    1.55 cm      PV Vmax:        1.30 m/s AV Area (Vmean):   1.58 cm      PV Peak grad:   6.8 mmHg AV Area (VTI):     1.74 cm      RVOT Peak grad: 3 mmHg AV Vmax:           213.50 cm/s AV Vmean:  148.000 cm/s AV VTI:            0.413 m AV Peak Grad:      18.2 mmHg AV Mean Grad:      10.0 mmHg LVOT Vmax:         95.80 cm/s LVOT Vmean:        67.500 cm/s LVOT VTI:          0.207 m LVOT/AV VTI ratio: 0.50  AORTA Ao Root diam: 3.70 cm Ao Asc diam:  3.30 cm MITRAL VALVE MV Area (PHT): 4.65 cm    SHUNTS MV Decel Time: 163 msec    Systemic VTI:  0.21 m MV E velocity: 86.50 cm/s  Systemic Diam: 2.10 cm MV A velocity: 76.00 cm/s MV E/A ratio:  1.14 Shaukat Khan Electronically signed by Denyse Bathe Signature Date/Time: 07/15/2024/2:55:27 PM    Final    DG Chest 2 View Result Date: 07/15/2024 EXAM: 2 VIEW(S) XRAY OF THE CHEST 07/15/2024 02:46:00 AM COMPARISON: 11/10/2023 CLINICAL HISTORY: SOB FINDINGS: LUNGS AND PLEURA: Lungs demonstrate patchy opacities in the bases bilaterally. Central venous congestion with mild edema is noted. No sizable pleural effusion is seen. No pneumothorax. HEART AND MEDIASTINUM: Borderline cardiomegaly. BONES AND SOFT TISSUES: No acute osseous abnormality. IMPRESSION: 1. Patchy bibasilar opacities 2. Changes of mild chf Electronically signed by: Oneil Devonshire MD 07/15/2024 03:01 AM EST RP Workstation: [id number]        Scheduled Meds:  amLODipine    5 mg Oral Daily   aspirin  EC  81 mg Oral Daily   dapagliflozin  propanediol  10 mg Oral Daily   enoxaparin  (LOVENOX ) injection  0.5 mg/kg Subcutaneous Q24H   furosemide   40 mg Intravenous BID   gabapentin   300 mg Oral BID   insulin  aspart  0-20 Units Subcutaneous TID WC   insulin  aspart  0-5 Units Subcutaneous QHS   insulin  glargine-yfgn  100 Units Subcutaneous QHS   losartan   50 mg Oral Daily   metFORMIN   1,000 mg Oral BID WC   pravastatin   40 mg Oral q1800   spironolactone   25 mg Oral Daily   Continuous Infusions:   LOS: 1 day    Time spent: 35 mins    Merdith Boyd Maree, MD [facility] Pager [phone]  If 7PM-7AM, please contact night-coverage [email or web address]  07/16/2024, 9:28 PM

## 2024-07-16 NOTE — Evaluation (Signed)
 Physical Therapy Evaluation and Discharge  Patient Details Name: [name] MRN: [id number] DOB: 19-Jan-1956 Today's Date: 07/16/2024  History of Present Illness  Patient is a 68 year old male with dyspnea, new onset CHF. PMH: type 2 diabetes mellitus, peripheral neuropathy, hypertension, hyperlipidemia, vitiligo  Clinical Impression  Patient is independent at baseline. He lives with his spouse.  He is Mod I with all activity today. Patient walked a lap in the hallway without shortness of breath. Sp02 91% on room air, heart rate 100bpm. Patient does report leg fatigue with walking with education provided on monitoring for signs of fatigue and need for rest breaks with home and community mobility. Patient is hopeful for discharge soon. No apparent acute PT needs at this time.       If plan is discharge home, recommend the following: Assist for transportation   Can travel by private vehicle        Equipment Recommendations None recommended by PT  Recommendations for Other Services       Functional Status Assessment Patient has not had a recent decline in their functional status     Precautions / Restrictions Precautions Precautions:  (low fall risk) Restrictions Weight Bearing Restrictions Per Provider Order: No      Mobility  Bed Mobility               General bed mobility comments: not assessed as patient sitting up on arrival and post session    Transfers Overall transfer level: Needs assistance Equipment used: None Transfers: Sit to/from Stand Sit to Stand: Modified independent (Device/Increase time)                Ambulation/Gait Ambulation/Gait assistance: Supervision, Modified independent (Device/Increase time) Gait Distance (Feet): 160 Feet Assistive device: None Gait Pattern/deviations: Step-through pattern Gait velocity: decreased     General Gait Details: no loss of balance with hallway ambulation. patient does report leg fatigue with  activity with education provided on progressing activity slowly and monitoring for need for breaks for fall prevention in home and community setting. Sp02 91%, HR 100bpm while walking on room air  Stairs            Wheelchair Mobility     Tilt Bed    Modified Rankin (Stroke Patients Only)       Balance Overall balance assessment: No apparent balance deficits (not formally assessed)                                           Pertinent Vitals/Pain Pain Assessment Pain Assessment: No/denies pain    Home Living Family/patient expects to be discharged to:: Private residence Living Arrangements: Spouse/significant other;Children Available Help at Discharge: Family Type of Home: House Home Access: Ramped entrance       Home Layout: One level        Prior Function Prior Level of Function : Independent/Modified Independent;Driving                     Extremity/Trunk Assessment   Upper Extremity Assessment Upper Extremity Assessment: Overall WFL for tasks assessed    Lower Extremity Assessment Lower Extremity Assessment: Overall WFL for tasks assessed       Communication   Communication Communication: No apparent difficulties    Cognition Arousal: Alert Behavior During Therapy: WFL for tasks assessed/performed   PT - Cognitive impairments: No apparent impairments  Following commands: Intact       Cueing Cueing Techniques: Verbal cues     General Comments      Exercises     Assessment/Plan    PT Assessment Patient does not need any further PT services  PT Problem List         PT Treatment Interventions      PT Goals (Current goals can be found in the Care Plan section)  Acute Rehab PT Goals PT Goal Formulation: All assessment and education complete, DC therapy    Frequency       Co-evaluation               AM-PAC PT 6 Clicks Mobility  Outcome Measure Help needed  turning from your back to your side while in a flat bed without using bedrails?: None Help needed moving from lying on your back to sitting on the side of a flat bed without using bedrails?: None Help needed moving to and from a bed to a chair (including a wheelchair)?: None Help needed standing up from a chair using your arms (e.g., wheelchair or bedside chair)?: None Help needed to walk in hospital room?: None Help needed climbing 3-5 steps with a railing? : None 6 Click Score: 24    End of Session   Activity Tolerance: Patient tolerated treatment well Patient left: in chair;with call bell/phone within reach   PT Visit Diagnosis: Muscle weakness (generalized) (M62.81)    Time: 8973-8962 PT Time Calculation (min) (ACUTE ONLY): 11 min   Charges:   PT Evaluation $PT Eval Low Complexity: 1 Low   PT General Charges $$ ACUTE PT VISIT: 1 Visit         [name], PT, MPT  [name] 07/16/2024, 11:36 AM

## 2024-07-16 NOTE — Progress Notes (Signed)
 [facility] CLINIC CARDIOLOGY PROGRESS NOTE   Patient ID: [name] MRN: [id number] DOB/AGE: 1956/08/08 68 y.o.  Admit date: 07/15/2024 Referring Physician Rockie Rams, NP Primary Physician Entzminger, Ethridge LABOR, MD  Primary Cardiologist none Reason for Consultation acute heart failure exacerbation  HPI: [name] is a 68 y.o. male with a past medical history of 2 diabetes mellitus with peripheral neuropathy, hypertension, hyperlipidemia, and vitiligo who presented to the ED on 07/15/2024 for dyspnea. Cardiology was consulted for further evaluation.   Interval History:  - Patient seen and examined this morning, sitting upright in bedside chair. - States shortness of breath is much improved today as well as lower extremity edema. - BP and heart rate remained stable. - Renal function stable with good urine output.  Review of systems complete and found to be negative unless listed above   Vitals:   07/15/24 2336 07/16/24 0417 07/16/24 0500 07/16/24 0816  BP: 134/84 (!) 142/82  129/88  Pulse: 81 81  81  Resp: 18 20  16   Temp: 98.5 F (36.9 C) 97.9 F (36.6 C)  98.4 F (36.9 C)  TempSrc:      SpO2: 94% 98%  95%  Weight:   102.6 kg   Height:         Intake/Output Summary (Last 24 hours) at 07/16/2024 9082 Last data filed at 07/15/2024 1700 Gross per 24 hour  Intake --  Output 1225 ml  Net -1225 ml     PHYSICAL EXAM General: Well-appearing male, well nourished, in no acute distress. HEENT: Normocephalic and atraumatic. Neck: No JVD.  Lungs: Normal respiratory effort on room air. Clear bilaterally to auscultation. No wheezes, crackles, rhonchi.  Heart: HRRR. Normal S1 and S2 without gallops or murmurs. Radial & DP pulses 2+ bilaterally. Abdomen: Non-distended appearing.  Msk: Normal strength and tone for age. Extremities: No clubbing, cyanosis or edema.   Neuro: Alert and oriented X 3. Psych: Mood appropriate, affect congruent.    LABS: Basic Metabolic  Panel: Recent Labs    07/15/24 0244 07/15/24 0710 07/16/24 0458  NA 138  --  144  K 3.3*  --  3.5  CL 102  --  106  CO2 26  --  28  GLUCOSE 233*  --  104*  BUN 14  --  19  CREATININE 1.00  --  1.18  CALCIUM 8.7*  --  9.7  MG  --  1.7 2.2   Liver Function Tests: No results for input(s): AST, ALT, ALKPHOS, BILITOT, PROT, ALBUMIN in the last 72 hours. No results for input(s): LIPASE, AMYLASE in the last 72 hours. CBC: Recent Labs    07/15/24 0244 07/16/24 0458  WBC 6.5 5.9  HGB 11.2* 11.7*  HCT 35.7* 37.3*  MCV 82.6 80.9  PLT 283 329   Cardiac Enzymes: No results for input(s): CKTOTAL, CKMB, CKMBINDEX, TROPONINIHS in the last 72 hours. BNP: No results for input(s): BNP in the last 72 hours. D-Dimer: No results for input(s): DDIMER in the last 72 hours. Hemoglobin A1C: Recent Labs    07/15/24 0244  HGBA1C 9.2*   Fasting Lipid Panel: Recent Labs    07/15/24 0953  CHOL 170  HDL 53  LDLCALC 100*  TRIG 84  CHOLHDL 3.2   Thyroid  Function Tests: Recent Labs    07/15/24 0953  TSH 1.270   Anemia Panel: Recent Labs    07/16/24 0458  FERRITIN 13*  TIBC 424  IRON 27*  RETICCTPCT 1.2    ECHOCARDIOGRAM COMPLETE Result  Date: 07/15/2024    ECHOCARDIOGRAM REPORT   Patient Name:   [name] Date of Exam: 07/15/2024 Medical Rec #:  [id number]      Height:       70.0 in Accession #:    [id number]     Weight:       235.0 lb Date of Birth:  April 23, 1956      BSA:          2.235 m Patient Age:    68 years       BP:           161/96 mmHg Patient Gender: M              HR:           82 bpm. Exam Location:  [facility] Procedure: 2D Echo, Cardiac Doppler, Color Doppler and Strain Analysis (Both            Spectral and Color Flow Doppler were utilized during procedure). Indications:     CHF I50.9  History:         Patient has no prior history of Echocardiogram examinations.  Sonographer:     Thedora Louder RDCS, FASE Referring Phys:  [id number] ROCKIE CROME  FOUST Diagnosing Phys: Denyse Bathe  Sonographer Comments: Global longitudinal strain was attempted. IMPRESSIONS  1. Left ventricular ejection fraction, by estimation, is 60 to 65%. The left ventricle has normal function. The left ventricle has no regional wall motion abnormalities. There is moderate concentric left ventricular hypertrophy. Left ventricular diastolic parameters were normal.  2. Right ventricular systolic function is normal. The right ventricular size is normal.  3. The mitral valve is normal in structure. Mild mitral valve regurgitation. No evidence of mitral stenosis.  4. The aortic valve is normal in structure. Aortic valve regurgitation is mild. No aortic stenosis is present.  5. The inferior vena cava is normal in size with greater than 50% respiratory variability, suggesting right atrial pressure of 3 mmHg. FINDINGS  Left Ventricle: Left ventricular ejection fraction, by estimation, is 60 to 65%. The left ventricle has normal function. The left ventricle has no regional wall motion abnormalities. Strain was performed and the global longitudinal strain is indeterminate. The left ventricular internal cavity size was normal in size. There is moderate concentric left ventricular hypertrophy. Left ventricular diastolic parameters were normal. Right Ventricle: The right ventricular size is normal. No increase in right ventricular wall thickness. Right ventricular systolic function is normal. Left Atrium: Left atrial size was normal in size. Right Atrium: Right atrial size was normal in size. Pericardium: There is no evidence of pericardial effusion. Mitral Valve: The mitral valve is normal in structure. Mild mitral valve regurgitation. No evidence of mitral valve stenosis. Tricuspid Valve: The tricuspid valve is normal in structure. Tricuspid valve regurgitation is mild . No evidence of tricuspid stenosis. Aortic Valve: The aortic valve is normal in structure. Aortic valve regurgitation is mild. No  aortic stenosis is present. Aortic valve mean gradient measures 10.0 mmHg. Aortic valve peak gradient measures 18.2 mmHg. Aortic valve area, by VTI measures 1.74 cm. Pulmonic Valve: The pulmonic valve was normal in structure. Pulmonic valve regurgitation is mild. No evidence of pulmonic stenosis. Aorta: The aortic root is normal in size and structure. Venous: The inferior vena cava is normal in size with greater than 50% respiratory variability, suggesting right atrial pressure of 3 mmHg. IAS/Shunts: No atrial level shunt detected by color flow Doppler. Additional Comments: 3D was performed not requiring image post  processing on an independent workstation and was normal.  LEFT VENTRICLE PLAX 2D LVIDd:         4.30 cm   Diastology LVIDs:         3.00 cm   LV e' medial:    9.36 cm/s LV PW:         1.70 cm   LV E/e' medial:  9.2 LV IVS:        1.40 cm   LV e' lateral:   8.81 cm/s LVOT diam:     2.10 cm   LV E/e' lateral: 9.8 LV SV:         72 LV SV Index:   32 LVOT Area:     3.46 cm LV IVRT:       71 msec  RIGHT VENTRICLE RV Basal diam:  2.70 cm     PULMONARY VEINS RV S prime:     22.00 cm/s  Diastolic Velocity: 58.60 cm/s TAPSE (M-mode): 2.4 cm      S/D Velocity:       1.20                             Systolic Velocity:  71.30 cm/s LEFT ATRIUM             Index        RIGHT ATRIUM           Index LA diam:        3.90 cm 1.74 cm/m   RA Area:     19.50 cm LA Vol (A2C):   65.8 ml 29.44 ml/m  RA Volume:   50.30 ml  22.50 ml/m LA Vol (A4C):   60.5 ml 27.06 ml/m LA Biplane Vol: 63.7 ml 28.50 ml/m  AORTIC VALVE                     PULMONIC VALVE AV Area (Vmax):    1.55 cm      PV Vmax:        1.30 m/s AV Area (Vmean):   1.58 cm      PV Peak grad:   6.8 mmHg AV Area (VTI):     1.74 cm      RVOT Peak grad: 3 mmHg AV Vmax:           213.50 cm/s AV Vmean:          148.000 cm/s AV VTI:            0.413 m AV Peak Grad:      18.2 mmHg AV Mean Grad:      10.0 mmHg LVOT Vmax:         95.80 cm/s LVOT Vmean:        67.500  cm/s LVOT VTI:          0.207 m LVOT/AV VTI ratio: 0.50  AORTA Ao Root diam: 3.70 cm Ao Asc diam:  3.30 cm MITRAL VALVE MV Area (PHT): 4.65 cm    SHUNTS MV Decel Time: 163 msec    Systemic VTI:  0.21 m MV E velocity: 86.50 cm/s  Systemic Diam: 2.10 cm MV A velocity: 76.00 cm/s MV E/A ratio:  1.14 Shaukat Khan Electronically signed by Denyse Bathe Signature Date/Time: 07/15/2024/2:55:27 PM    Final    DG Chest 2 View Result Date: 07/15/2024 EXAM: 2 VIEW(S) XRAY OF THE CHEST 07/15/2024 02:46:00 AM COMPARISON: 11/10/2023 CLINICAL HISTORY: SOB FINDINGS: LUNGS AND PLEURA:  Lungs demonstrate patchy opacities in the bases bilaterally. Central venous congestion with mild edema is noted. No sizable pleural effusion is seen. No pneumothorax. HEART AND MEDIASTINUM: Borderline cardiomegaly. BONES AND SOFT TISSUES: No acute osseous abnormality. IMPRESSION: 1. Patchy bibasilar opacities 2. Changes of mild chf Electronically signed by: Oneil Devonshire MD 07/15/2024 03:01 AM EST RP Workstation: [id number]     ECHO as above  TELEMETRY (personally reviewed): Sinus rhythm rate 90s  EKG (personally reviewed): Normal sinus rhythm rate 88 bpm  DATA reviewed by me 07/16/24: last 24h vitals tele labs imaging I/O, hospitalist progress note  Principal Problem:   CHF (congestive heart failure) (HCC) Active Problems:   Type II diabetes mellitus (HCC)   HTN (hypertension)   Hypercholesterolemia   Elevated troponin   Acute hypoxic respiratory failure (HCC)   Normocytic anemia   Hypokalemia    ASSESSMENT AND PLAN: [name] is a 68 y.o. male with a past medical history of 2 diabetes mellitus with peripheral neuropathy, hypertension, hyperlipidemia, and vitiligo who presented to the ED on 07/15/2024 for dyspnea. Cardiology was consulted for further evaluation.   # Acute HFpEF # Hypertension # Hyperlipidemia Patient presented with worsening dyspnea.  BNP 152.  Troponins mildly elevated and flat trending at 48 >  48.  EKG normal sinus rhythm rate 88 bpm without acute ischemic changes.  Chest x-ray suggestive of mild CHF.  Echo this admission with EF 60-65%, no wall motion abnormalities, moderate LVH, normal diastolic function, mild MR. - Continue IV Lasix  40 mg twice daily.  Likely plan to transition to p.o. tomorrow. - Increase spironolactone  to 25 mg daily.  Continue losartan  50 mg daily, Farxiga  10 mg daily.  Farxiga  is not likely to be affordable on discharge as reported co-pay per pharmacy is $302 per month.  Can consider meds to beds for first month.  This patient's case was discussed and created with Dr. Custovic and she is in agreement.  Signed:  Danita Bloch, PA-C  07/16/2024, 9:17 AM [facility]

## 2024-07-17 ENCOUNTER — Other Ambulatory Visit: Payer: Self-pay

## 2024-07-17 DIAGNOSIS — I5031 Acute diastolic (congestive) heart failure: Secondary | ICD-10-CM | POA: Diagnosis not present

## 2024-07-17 DIAGNOSIS — D649 Anemia, unspecified: Secondary | ICD-10-CM | POA: Diagnosis not present

## 2024-07-17 DIAGNOSIS — E7800 Pure hypercholesterolemia, unspecified: Secondary | ICD-10-CM | POA: Diagnosis not present

## 2024-07-17 DIAGNOSIS — E1100 Type 2 diabetes mellitus with hyperosmolarity without nonketotic hyperglycemic-hyperosmolar coma (NKHHC): Secondary | ICD-10-CM | POA: Diagnosis not present

## 2024-07-17 LAB — CBC
HCT: 38 % — ABNORMAL LOW (ref 39.0–52.0)
Hemoglobin: 12.3 g/dL — ABNORMAL LOW (ref 13.0–17.0)
MCH: 26 pg (ref 26.0–34.0)
MCHC: 32.4 g/dL (ref 30.0–36.0)
MCV: 80.3 fL (ref 80.0–100.0)
Platelets: 344 K/uL (ref 150–400)
RBC: 4.73 MIL/uL (ref 4.22–5.81)
RDW: 14.9 % (ref 11.5–15.5)
WBC: 7.5 K/uL (ref 4.0–10.5)
nRBC: 0 % (ref 0.0–0.2)

## 2024-07-17 LAB — GLUCOSE, CAPILLARY
Glucose-Capillary: 109 mg/dL — ABNORMAL HIGH (ref 70–99)
Glucose-Capillary: 64 mg/dL — ABNORMAL LOW (ref 70–99)
Glucose-Capillary: 234 mg/dL — ABNORMAL HIGH (ref 70–99)

## 2024-07-17 LAB — BASIC METABOLIC PANEL WITH GFR
Anion gap: 10 (ref 5–15)
BUN: 28 mg/dL — ABNORMAL HIGH (ref 8–23)
CO2: 29 mmol/L (ref 22–32)
Calcium: 9.5 mg/dL (ref 8.9–10.3)
Chloride: 103 mmol/L (ref 98–111)
Creatinine, Ser: 1.28 mg/dL — ABNORMAL HIGH (ref 0.61–1.24)
GFR, Estimated: >60 mL/min (ref >60)
Glucose, Bld: 68 mg/dL — ABNORMAL LOW (ref 70–99)
Potassium: 3.3 mmol/L — ABNORMAL LOW (ref 3.5–5.1)
Sodium: 142 mmol/L (ref 135–145)

## 2024-07-17 MED ORDER — POTASSIUM CHLORIDE CRYS ER 20 MEQ PO TBCR
40.0000 meq | EXTENDED_RELEASE_TABLET | Freq: Once | ORAL | Status: AC
  Administered 2024-07-17: 40 meq via ORAL
  Filled 2024-07-17: qty 2

## 2024-07-17 MED ORDER — FUROSEMIDE 40 MG PO TABS
40.0000 mg | ORAL_TABLET | Freq: Every day | ORAL | Status: DC
  Administered 2024-07-17: 40 mg via ORAL
  Filled 2024-07-17: qty 1

## 2024-07-17 MED ORDER — SPIRONOLACTONE 25 MG PO TABS
25.0000 mg | ORAL_TABLET | Freq: Every day | ORAL | 0 refills | Status: AC
  Filled 2024-07-17: qty 30, 30d supply, fill #0

## 2024-07-17 MED ORDER — FUROSEMIDE 40 MG PO TABS
40.0000 mg | ORAL_TABLET | Freq: Every day | ORAL | 0 refills | Status: AC
  Filled 2024-07-17: qty 30, 30d supply, fill #0

## 2024-07-17 MED ORDER — BASAGLAR KWIKPEN 100 UNIT/ML ~~LOC~~ SOPN: 90.0000 [IU] | PEN_INJECTOR | Freq: Every day | SUBCUTANEOUS | Status: AC

## 2024-07-17 MED ORDER — DAPAGLIFLOZIN PROPANEDIOL 10 MG PO TABS
10.0000 mg | ORAL_TABLET | Freq: Every day | ORAL | 0 refills | Status: AC
  Filled 2024-07-17: qty 30, 30d supply, fill #0

## 2024-07-17 MED ORDER — LOSARTAN POTASSIUM 50 MG PO TABS
50.0000 mg | ORAL_TABLET | Freq: Every day | ORAL | 0 refills | Status: AC
  Filled 2024-07-17: qty 30, 30d supply, fill #0

## 2024-07-17 MED ORDER — ASPIRIN 81 MG PO TBEC
81.0000 mg | DELAYED_RELEASE_TABLET | Freq: Every day | ORAL | 12 refills | Status: AC
  Filled 2024-07-17: qty 30, 30d supply, fill #0

## 2024-07-17 NOTE — Progress Notes (Signed)
 Heart Failure Navigator Progress Note  Assessed for Heart & Vascular TOC clinic readiness.  Patient does not meet criteria due to current [facility] Cardiology consult.  Navigator will sign off at this time.  Charmaine Pines, RN, BSN [facility] Heart Failure Navigator Secure Chat Only

## 2024-07-17 NOTE — Plan of Care (Signed)
  Problem: Education: Goal: Ability to demonstrate management of disease process will improve Outcome: Progressing Goal: Ability to verbalize understanding of medication therapies will improve Outcome: Progressing   Problem: Activity: Goal: Capacity to carry out activities will improve Outcome: Progressing   Problem: Cardiac: Goal: Ability to achieve and maintain adequate cardiopulmonary perfusion will improve Outcome: Progressing   Problem: Health Behavior/Discharge Planning: Goal: Ability to manage health-related needs will improve Outcome: Progressing   Problem: Clinical Measurements: Goal: Ability to maintain clinical measurements within normal limits will improve Outcome: Progressing Goal: Will remain free from infection Outcome: Progressing Goal: Diagnostic test results will improve Outcome: Progressing Goal: Respiratory complications will improve Outcome: Progressing Goal: Cardiovascular complication will be avoided Outcome: Progressing   Problem: Activity: Goal: Risk for activity intolerance will decrease Outcome: Progressing   Problem: Nutrition: Goal: Adequate nutrition will be maintained Outcome: Progressing   Problem: Coping: Goal: Level of anxiety will decrease Outcome: Progressing   Problem: Elimination: Goal: Will not experience complications related to bowel motility Outcome: Progressing   Problem: Safety: Goal: Ability to remain free from injury will improve Outcome: Progressing   Problem: Skin Integrity: Goal: Risk for impaired skin integrity will decrease Outcome: Progressing

## 2024-07-17 NOTE — Progress Notes (Signed)
 Heart Failure Stewardship Pharmacy Note  PCP: Entzminger, Ethridge LABOR, MD PCP-Cardiologist: None  HPI: [name] is a 68 y.o. male with T2DM, peripheral neuropathy, HTN, HLD, who presented with progressive shortness of breath over the past 6 months along with fatigue and orthopnea. On admission, proBNP was 152, HS-troponin was 39, TSAT was 9, ferritin was 13, and A1c was 9.2. Chest x-ray noted mild CHF changes. TTE noted LVEF 60-65%, moderate concentric LVH, mild MR, LV IVS 1.4 cm.  Pertinent Lab Values: Creatinine  Date Value Ref Range Status  12/07/2012 1.23 0.60 - 1.30 mg/dL Final   Creatinine, Ser  Date Value Ref Range Status  07/17/2024 1.28 (H) 0.61 - 1.24 mg/dL Final   BUN  Date Value Ref Range Status  07/17/2024 28 (H) 8 - 23 mg/dL Final  95/80/7981 20 8 - 27 mg/dL Final  95/82/7985 12 7 - 18 mg/dL Final   Potassium  Date Value Ref Range Status  07/17/2024 3.3 (L) 3.5 - 5.1 mmol/L Final  12/07/2012 3.6 3.5 - 5.1 mmol/L Final   Sodium  Date Value Ref Range Status  07/17/2024 142 135 - 145 mmol/L Final  12/09/2016 139 134 - 144 mmol/L Final  12/07/2012 136 136 - 145 mmol/L Final   B Natriuretic Peptide  Date Value Ref Range Status  12/21/2020 33.0 0.0 - 100.0 pg/mL Final    Comment:    Performed at [facility], [address] [place], [place] [address]   Magnesium   Date Value Ref Range Status  07/16/2024 2.2 1.7 - 2.4 mg/dL Final    Comment:    Performed at [facility], [address] [place], [place] [address]   Hemoglobin A1C  Date Value Ref Range Status  05/02/2017 9.0  Final   Hgb A1c MFr Bld  Date Value Ref Range Status  07/15/2024 9.2 (H) 4.8 - 5.6 % Final    Comment:    (NOTE) Diagnosis of Diabetes The following HbA1c ranges recommended by the American Diabetes Association (ADA) may be used as an aid in the diagnosis of diabetes mellitus.  Hemoglobin             Suggested A1C NGSP%              Diagnosis  <5.7                    Non Diabetic  5.7-6.4                Pre-Diabetic  >6.4                   Diabetic  <7.0                   Glycemic control for                       adults with diabetes.     TSH  Date Value Ref Range Status  07/15/2024 1.270 0.350 - 4.500 uIU/mL Final    Comment:    Performed at [facility], [address] [place], [place] [address]  12/09/2016 1.460 0.450 - 4.500 uIU/mL Final    Vital Signs:  Temp:  [97.9 F (36.6 C)-98.3 F (36.8 C)] 98.2 F (36.8 C) (11/25 0737) Pulse Rate:  [82-99] 85 (11/25 0737) Resp:  [16-20] 16 (11/25 0737) BP: (124-153)/(75-99) 137/75 (11/25 0737) SpO2:  [95 %-99 %] 95 % (11/25 0737)  Intake/Output Summary (Last 24 hours) at  07/17/2024 0818 Last data filed at 07/16/2024 1907 Gross per 24 hour  Intake 480 ml  Output --  Net 480 ml    Current Heart Failure Medications:  Loop diuretic: furosemide  40 mg IV BID Beta-Blocker: none ACEI/ARB/ARNI: losartan  50 mg daily MRA: spironolactone  12.5 mg daily SGLT2i: Farxiga  10 mg daily Other: amlodipine  5 mg daily  Prior to admission Heart Failure Medications:  Taking no CHF medications PTA  Assessment: 1. Acute on chronic diastolic heart failure (LVEF 60-65%)  , due to unknown etiology. NYHA class II symptoms.  -Symptoms: Reports feeling much improved. Denies shortness of breath, orthopnea, and fatigue.  -Volume: Patient appears euvolemic. proBNP was not elevated on admission, CXR noted only mild CHF, and patient is feeling better. Creatinine is up today.  Furosemide  transitioned to oral 40 mg daily. This may require further reduction in clinic pending diuresis on GDMT alone. -Hemodynamics: BP is elevated. HR 80s. -SGLT2i: Continue Farxiga  10 mg daily. -MRA: Agree with adding spironolactone  25 mg daily. -ARNI: Can consider transitioning from losartan  to Entresto 24/26 mg BID.  Plan: 1) Medication changes recommended at this time: - Consider transitioning from losartan   to Entresto 24-26 mg BID. If a grant is needed please reach and pharmacy will check for eligibility.  2) Patient assistance: -Entresto, Farxiga , and Jardiance cost $302 due to deductible, but would be $47 dollars each once deductible is met. Patient may qualify for a grant. If needed, please reach out.  3) Education: - Patient has been educated on current HF medications and potential additions to HF medication regimen - Patient verbalizes understanding that over the next few months, these medication doses may change and more medications may be added to optimize HF regimen - Patient has been educated on basic disease state pathophysiology and goals of therapy  Medication Assistance / Insurance Benefits Check: Does the patient have prescription insurance?    Please do not hesitate to reach out with questions or concerns,  Jaun Bash, PharmD, CPP, BCPS, [facility] Heart Failure Pharmacist  Phone - [phone] 07/17/2024 8:18 AM

## 2024-07-17 NOTE — Progress Notes (Signed)
 [facility] CLINIC CARDIOLOGY PROGRESS NOTE   Patient ID: [name] MRN: [id number] DOB/AGE: Jan 06, 1956 68 y.o.  Admit date: 07/15/2024 Referring Physician Rockie Rams, NP Primary Physician Entzminger, Ethridge LABOR, MD  Primary Cardiologist none Reason for Consultation acute heart failure exacerbation  HPI: [name] is a 69 y.o. male with a past medical history of 2 diabetes mellitus with peripheral neuropathy, hypertension, hyperlipidemia, and vitiligo who presented to the ED on 07/15/2024 for dyspnea. Cardiology was consulted for further evaluation.   Interval History:  - Patient seen and examined this morning, sitting upright in bedside chair. - Denies any SOB or LE edema.  - BP and heart rate remained stable.  Review of systems complete and found to be negative unless listed above   Vitals:   07/16/24 2055 07/16/24 2251 07/17/24 0224 07/17/24 0737  BP: 134/82 (!) 153/99 132/77 137/75  Pulse: 94 92 99 85  Resp: 20 16 16 16   Temp: 98.3 F (36.8 C) 98.2 F (36.8 C) 97.9 F (36.6 C) 98.2 F (36.8 C)  TempSrc:    Oral  SpO2: 95% 97% 99% 95%  Weight:      Height:         Intake/Output Summary (Last 24 hours) at 07/17/2024 0946 Last data filed at 07/17/2024 9256 Gross per 24 hour  Intake 720 ml  Output --  Net 720 ml     PHYSICAL EXAM General: Well-appearing male, well nourished, in no acute distress. HEENT: Normocephalic and atraumatic. Neck: No JVD.  Lungs: Normal respiratory effort on room air. Clear bilaterally to auscultation. No wheezes, crackles, rhonchi.  Heart: HRRR. Normal S1 and S2 without gallops or murmurs. Radial & DP pulses 2+ bilaterally. Abdomen: Non-distended appearing.  Msk: Normal strength and tone for age. Extremities: No clubbing, cyanosis or edema.   Neuro: Alert and oriented X 3. Psych: Mood appropriate, affect congruent.    LABS: Basic Metabolic Panel: Recent Labs    07/15/24 0710 07/16/24 0458 07/17/24 0518  NA  --  144 142   K  --  3.5 3.3*  CL  --  106 103  CO2  --  28 29  GLUCOSE  --  104* 68*  BUN  --  19 28*  CREATININE  --  1.18 1.28*  CALCIUM  --  9.7 9.5  MG 1.7 2.2  --    Liver Function Tests: No results for input(s): AST, ALT, ALKPHOS, BILITOT, PROT, ALBUMIN in the last 72 hours. No results for input(s): LIPASE, AMYLASE in the last 72 hours. CBC: Recent Labs    07/16/24 0458 07/17/24 0518  WBC 5.9 7.5  HGB 11.7* 12.3*  HCT 37.3* 38.0*  MCV 80.9 80.3  PLT 329 344   Cardiac Enzymes: No results for input(s): CKTOTAL, CKMB, CKMBINDEX, TROPONINIHS in the last 72 hours. BNP: No results for input(s): BNP in the last 72 hours. D-Dimer: No results for input(s): DDIMER in the last 72 hours. Hemoglobin A1C: Recent Labs    07/15/24 0244  HGBA1C 9.2*   Fasting Lipid Panel: Recent Labs    07/15/24 0953  CHOL 170  HDL 53  LDLCALC 100*  TRIG 84  CHOLHDL 3.2   Thyroid  Function Tests: Recent Labs    07/15/24 0953  TSH 1.270   Anemia Panel: Recent Labs    07/16/24 0458  VITAMINB12 466  FOLATE 10.6  FERRITIN 13*  TIBC 424  IRON 27*  RETICCTPCT 1.2    ECHOCARDIOGRAM COMPLETE Result Date: 07/15/2024    ECHOCARDIOGRAM REPORT  Patient Name:   [name] Date of Exam: 07/15/2024 Medical Rec #:  [id number]      Height:       70.0 in Accession #:    [id number]     Weight:       235.0 lb Date of Birth:  December 10, 1955      BSA:          2.235 m Patient Age:    68 years       BP:           161/96 mmHg Patient Gender: M              HR:           82 bpm. Exam Location:  [facility] Procedure: 2D Echo, Cardiac Doppler, Color Doppler and Strain Analysis (Both            Spectral and Color Flow Doppler were utilized during procedure). Indications:     CHF I50.9  History:         Patient has no prior history of Echocardiogram examinations.  Sonographer:     Thedora Louder RDCS, FASE Referring Phys:  [id number] ROCKIE CROME FOUST Diagnosing Phys: Denyse Bathe  Sonographer Comments:  Global longitudinal strain was attempted. IMPRESSIONS  1. Left ventricular ejection fraction, by estimation, is 60 to 65%. The left ventricle has normal function. The left ventricle has no regional wall motion abnormalities. There is moderate concentric left ventricular hypertrophy. Left ventricular diastolic parameters were normal.  2. Right ventricular systolic function is normal. The right ventricular size is normal.  3. The mitral valve is normal in structure. Mild mitral valve regurgitation. No evidence of mitral stenosis.  4. The aortic valve is normal in structure. Aortic valve regurgitation is mild. No aortic stenosis is present.  5. The inferior vena cava is normal in size with greater than 50% respiratory variability, suggesting right atrial pressure of 3 mmHg. FINDINGS  Left Ventricle: Left ventricular ejection fraction, by estimation, is 60 to 65%. The left ventricle has normal function. The left ventricle has no regional wall motion abnormalities. Strain was performed and the global longitudinal strain is indeterminate. The left ventricular internal cavity size was normal in size. There is moderate concentric left ventricular hypertrophy. Left ventricular diastolic parameters were normal. Right Ventricle: The right ventricular size is normal. No increase in right ventricular wall thickness. Right ventricular systolic function is normal. Left Atrium: Left atrial size was normal in size. Right Atrium: Right atrial size was normal in size. Pericardium: There is no evidence of pericardial effusion. Mitral Valve: The mitral valve is normal in structure. Mild mitral valve regurgitation. No evidence of mitral valve stenosis. Tricuspid Valve: The tricuspid valve is normal in structure. Tricuspid valve regurgitation is mild . No evidence of tricuspid stenosis. Aortic Valve: The aortic valve is normal in structure. Aortic valve regurgitation is mild. No aortic stenosis is present. Aortic valve mean gradient  measures 10.0 mmHg. Aortic valve peak gradient measures 18.2 mmHg. Aortic valve area, by VTI measures 1.74 cm. Pulmonic Valve: The pulmonic valve was normal in structure. Pulmonic valve regurgitation is mild. No evidence of pulmonic stenosis. Aorta: The aortic root is normal in size and structure. Venous: The inferior vena cava is normal in size with greater than 50% respiratory variability, suggesting right atrial pressure of 3 mmHg. IAS/Shunts: No atrial level shunt detected by color flow Doppler. Additional Comments: 3D was performed not requiring image post processing on an independent workstation and was normal.  LEFT VENTRICLE PLAX 2D LVIDd:         4.30 cm   Diastology LVIDs:         3.00 cm   LV e' medial:    9.36 cm/s LV PW:         1.70 cm   LV E/e' medial:  9.2 LV IVS:        1.40 cm   LV e' lateral:   8.81 cm/s LVOT diam:     2.10 cm   LV E/e' lateral: 9.8 LV SV:         72 LV SV Index:   32 LVOT Area:     3.46 cm LV IVRT:       71 msec  RIGHT VENTRICLE RV Basal diam:  2.70 cm     PULMONARY VEINS RV S prime:     22.00 cm/s  Diastolic Velocity: 58.60 cm/s TAPSE (M-mode): 2.4 cm      S/D Velocity:       1.20                             Systolic Velocity:  71.30 cm/s LEFT ATRIUM             Index        RIGHT ATRIUM           Index LA diam:        3.90 cm 1.74 cm/m   RA Area:     19.50 cm LA Vol (A2C):   65.8 ml 29.44 ml/m  RA Volume:   50.30 ml  22.50 ml/m LA Vol (A4C):   60.5 ml 27.06 ml/m LA Biplane Vol: 63.7 ml 28.50 ml/m  AORTIC VALVE                     PULMONIC VALVE AV Area (Vmax):    1.55 cm      PV Vmax:        1.30 m/s AV Area (Vmean):   1.58 cm      PV Peak grad:   6.8 mmHg AV Area (VTI):     1.74 cm      RVOT Peak grad: 3 mmHg AV Vmax:           213.50 cm/s AV Vmean:          148.000 cm/s AV VTI:            0.413 m AV Peak Grad:      18.2 mmHg AV Mean Grad:      10.0 mmHg LVOT Vmax:         95.80 cm/s LVOT Vmean:        67.500 cm/s LVOT VTI:          0.207 m LVOT/AV VTI ratio: 0.50   AORTA Ao Root diam: 3.70 cm Ao Asc diam:  3.30 cm MITRAL VALVE MV Area (PHT): 4.65 cm    SHUNTS MV Decel Time: 163 msec    Systemic VTI:  0.21 m MV E velocity: 86.50 cm/s  Systemic Diam: 2.10 cm MV A velocity: 76.00 cm/s MV E/A ratio:  1.14 Shaukat Bluelinx signed by Denyse Bathe Signature Date/Time: 07/15/2024/2:55:27 PM    Final      ECHO as above  TELEMETRY (personally reviewed): not on tele  EKG (personally reviewed): Normal sinus rhythm rate 88 bpm  DATA reviewed by me 07/17/24: last 24h vitals tele labs imaging I/O,  hospitalist progress note  Principal Problem:   CHF (congestive heart failure) (HCC) Active Problems:   Type II diabetes mellitus (HCC)   HTN (hypertension)   Hypercholesterolemia   Elevated troponin   Acute hypoxic respiratory failure (HCC)   Normocytic anemia   Hypokalemia    ASSESSMENT AND PLAN: [name] is a 69 y.o. male with a past medical history of 2 diabetes mellitus with peripheral neuropathy, hypertension, hyperlipidemia, and vitiligo who presented to the ED on 07/15/2024 for dyspnea. Cardiology was consulted for further evaluation.   # Acute HFpEF # Hypertension # Hyperlipidemia Patient presented with worsening dyspnea.  BNP 152.  Troponins mildly elevated and flat trending at 48 > 48.  EKG normal sinus rhythm rate 88 bpm without acute ischemic changes.  Chest x-ray suggestive of mild CHF.  Echo this admission with EF 60-65%, no wall motion abnormalities, moderate LVH, normal diastolic function, mild MR. - Transition to PO lasix  40 mg daily.  - Continue spironolactone  25 mg daily, losartan  50 mg daily, Farxiga  10 mg daily.  Farxiga  is not likely to be affordable on discharge as reported co-pay per pharmacy is $302 per month.  Can consider meds to beds for first month.  Ok for discharge today from a cardiac perspective. Will arrange for follow up in clinic with Dr. Wilburn in 1-2 weeks.    This patient's case was discussed and created  with Dr. Custovic and she is in agreement.  Signed:  Danita Bloch, PA-C  07/17/2024, 9:46 AM [facility] Cardiology

## 2024-07-17 NOTE — Progress Notes (Signed)
 Hypoglycemic Event  CBG: 64  Treatment: 8 oz juice/soda  Symptoms: None  Follow-up CBG: Time:0803 CBG Result:109  Possible Reasons for Event: Medication regimen:    Comments/MD notified: Yes,Metformin  DC    Geo Slone

## 2024-07-17 NOTE — Care Management Important Message (Signed)
 Important Message  Patient Details  Name: [name] MRN: [id number] Date of Birth: 04-08-1956   Important Message Given:  Yes - Medicare IM     [name] 07/17/2024, 1:55 PM

## 2024-07-17 NOTE — Inpatient Diabetes Management (Addendum)
 Inpatient Diabetes Program Recommendations  AACE/ADA: New Consensus Statement on Inpatient Glycemic Control   Target Ranges:  Prepandial:   less than 140 mg/dL      Peak postprandial:   less than 180 mg/dL (1-2 hours)      Critically ill patients:  140 - 180 mg/dL    Latest Reference Range & Units 07/17/24 05:18  Glucose 70 - 99 mg/dL 68 (L)    Latest Reference Range & Units 07/16/24 08:17 07/16/24 11:53 07/16/24 16:05 07/16/24 21:59  Glucose-Capillary 70 - 99 mg/dL 885 (H) 848 (H) 873 (H) 220 (H)   Review of Glycemic Control  Diabetes history: DM2 Outpatient Diabetes medications: Glipizide 10 mg BID (not taking), Basaglar  100 units at bedtime, Humalog 5 units TID with meals (not taking), Metformin  1000 mg BID, Trulicity 4.5 mg Qweek Current orders for Inpatient glycemic control: Semglee  100 units at bedtime, Novolog  0-20 units TID with meals, Novolog  0-5 units at bedtime, Metformin  1000 mg BID, Farxiga  10 mg daily  Inpatient Diabetes Program Recommendations:    Insulin : Lab glucose 68 mg/dl today.  Please consider decreasing Semglee  to 90 units at bedtime.  NOTE: Patient admitted with new onset CHF, elevated troponin, acute respiratory failure, anemia, and hypokalemia.  Patient sees Dr. Damian (Endocrinologist) and was last seen on 04/17/24. Per office note on 04/17/24, A1C was 9.5% and patient was asked to increase Basaglar  to 100 units daily, take Humalog 25-40 units TID before meals, Metformin  1000 mg BID, and Trulicity 4.5 mg Qweek.     Thanks, Earnie Gainer, RN, MSN, CDCES Diabetes Coordinator Inpatient Diabetes Program [phone] (Team Pager from 8am to 5pm)

## 2024-07-18 NOTE — Discharge Summary (Signed)
 Physician Discharge Summary   Patient: [name] MRN: [id number] DOB: Oct 28, 1955  Admit date:     07/15/2024  Discharge date: 07/17/2024  Discharge Physician: Cresencio Fairly   PCP: Sampson Ethridge LABOR, MD   Recommendations at discharge:    F/up with outpt providers as requested  Discharge Diagnoses: Principal Problem:   CHF (congestive heart failure) (HCC) Active Problems:   Type II diabetes mellitus (HCC)   HTN (hypertension)   Hypercholesterolemia   Elevated troponin   Acute hypoxic respiratory failure (HCC)   Normocytic anemia   Hypokalemia  Hospital Course: Assessment and Plan:  68 y.o. year old male with past medical history of type 2 diabetes mellitus with peripheral neuropathy, hypertension, hyperlipidemia, and vitiligo.  He presents to [facility] regional ED with reports of dyspnea.  This has been ongoing for about 6 months however he noted it to be particularly worse over the last couple of days and further worsened last night prompting him to present to the ED.  He reports that over the last 6 months he notices that he is dyspneic and fatigue when walking and often has to rest or take a breath after coming down the steps or prior to going back up the steps.    11/24: cardio c/s, DC tele, transfer to med-surg   ##New onset congestive heart failure ##Elevated Troponin - Diuresed with Lasix  - Echo shows normal LV function - neg serial troponin - Cardiology seen, outpt cardio f/up   ##Acute hypoxic respiratory failure, resolved - on RA   ##Normocytic anemia Patient denies signs of bleeding or bruising.  Denies melena, hematochezia   ##Hypokalemia - repleted   #Type 2 diabetes mellitus with peripheral neuropathy #Hypertension  #Hyperlipidemia          Consultants: Cardio  Disposition: Home Diet recommendation:  Discharge Diet Orders (From admission, onward)     Start     Ordered   07/17/24 0000  Diet - low sodium heart healthy        07/17/24  1231           Carb modified diet DISCHARGE MEDICATION: Allergies as of 07/17/2024   No Known Allergies      Medication List     STOP taking these medications    amLODipine  5 MG tablet Commonly known as: NORVASC    glipiZIDE 10 MG tablet Commonly known as: GLUCOTROL   hydrochlorothiazide 25 MG tablet Commonly known as: HYDRODIURIL       TAKE these medications    albuterol  108 (90 Base) MCG/ACT inhaler Commonly known as: VENTOLIN  HFA INHALE 2 PUFFS BY MOUTH EVERY 6 HOURS AS NEEDED FOR WHEEZING OR SHORTNESS OF BREATH   aspirin  EC 81 MG tablet Take 1 tablet (81 mg total) by mouth daily. Swallow whole.   Basaglar  KwikPen 100 UNIT/ML Inject 90 Units into the skin at bedtime. What changed:  how much to take Another medication with the same name was removed. Continue taking this medication, and follow the directions you see here.   dapagliflozin  propanediol 10 MG Tabs tablet Commonly known as: FARXIGA  Take 1 tablet (10 mg total) by mouth daily.   econazole nitrate 1 % cream 1 application 2 (two) times daily.   FreeStyle Libre 14 Day Sensor Misc Use 1 device for continuous glucose monitoring for diabetes   furosemide  40 MG tablet Commonly known as: LASIX  Take 1 tablet (40 mg total) by mouth daily.   gabapentin  300 MG capsule Commonly known as: NEURONTIN  Take 300 mg by mouth  2 (two) times daily.   EXPRESS MED TEST STRIP PACK VI 1 strip daily.   glucose blood test strip 1 strip 3 (three) times daily.   HumaLOG KwikPen 100 UNIT/ML KwikPen Generic drug: insulin  lispro Sliding scale. If above 150 inject 22 units. If over 200 inject 24 units. If over 250 inject 26 units. If over 300 inject 28 units. If over 350 inject 30 units. If over 400 call MD. For diabetes Subcutaneous once a day What changed: Another medication with the same name was removed. Continue taking this medication, and follow the directions you see here.   losartan  50 MG tablet Commonly  known as: COZAAR  Take 1 tablet (50 mg total) by mouth daily.   metFORMIN  1000 MG tablet Commonly known as: GLUCOPHAGE  Take 1,000 mg by mouth 2 (two) times daily with a meal.   pravastatin  40 MG tablet Commonly known as: PRAVACHOL  Take 40 mg by mouth daily.   spironolactone  25 MG tablet Commonly known as: ALDACTONE  Take 1 tablet (25 mg total) by mouth daily.   Trulicity 4.5 MG/0.5ML Soaj Generic drug: Dulaglutide Inject 4.5 mg into the skin once a week.        Follow-up Information     Alluri, Keller BROCKS, MD. Go in 1 week(s).   Specialty: Cardiology Contact information: [address] [place] [address] [phone]         Entzminger, Ethridge LABOR, MD. Go on 07/23/2024.   Specialty: Internal Medicine Why: [facility] Discharge F/UP.Go at 1:00pm you will need to arrive 15 mins. early to check in. Contact information: [address] [place] [address] [phone]         Damian Therisa HERO, MD. Schedule an appointment as soon as possible for a visit in 2 week(s).   Specialty: Endocrinology Why: [facility] Discharge F/UP Contact information: [address] [facility] [place] [address] [phone]                Discharge Exam: Fredricka Weights   07/15/24 0233 07/16/24 0500  Weight: 106.6 kg 102.6 kg   General exam: Appears calm and comfortable  Respiratory system: Clear to auscultation. Respiratory effort normal. Cardiovascular system: S1 & S2 heard, RRR. No  murmurs, trace pedal edema. Gastrointestinal system: Abdomen is soft, benign Central nervous system: Alert and oriented. No focal neurological deficits. Extremities: Symmetric 5 x 5 power. Skin: No rashes, lesions or ulcers Psychiatry: Judgement and insight appear normal. Mood & affect appropriate.   Condition at discharge: fair  The results of significant diagnostics from this hospitalization (including imaging, microbiology, ancillary and laboratory) are  listed below for reference.   Imaging Studies: ECHOCARDIOGRAM COMPLETE Result Date: 07/15/2024    ECHOCARDIOGRAM REPORT   Patient Name:   [name] Date of Exam: 07/15/2024 Medical Rec #:  [id number]      Height:       70.0 in Accession #:    [id number]     Weight:       235.0 lb Date of Birth:  09/29/1955      BSA:          2.235 m Patient Age:    68 years       BP:           161/96 mmHg Patient Gender: M              HR:           82 bpm. Exam Location:  [facility] Procedure: 2D  Echo, Cardiac Doppler, Color Doppler and Strain Analysis (Both            Spectral and Color Flow Doppler were utilized during procedure). Indications:     CHF I50.9  History:         Patient has no prior history of Echocardiogram examinations.  Sonographer:     Thedora Louder RDCS, FASE Referring Phys:  [id number] ROCKIE CROME FOUST Diagnosing Phys: Denyse Bathe  Sonographer Comments: Global longitudinal strain was attempted. IMPRESSIONS  1. Left ventricular ejection fraction, by estimation, is 60 to 65%. The left ventricle has normal function. The left ventricle has no regional wall motion abnormalities. There is moderate concentric left ventricular hypertrophy. Left ventricular diastolic parameters were normal.  2. Right ventricular systolic function is normal. The right ventricular size is normal.  3. The mitral valve is normal in structure. Mild mitral valve regurgitation. No evidence of mitral stenosis.  4. The aortic valve is normal in structure. Aortic valve regurgitation is mild. No aortic stenosis is present.  5. The inferior vena cava is normal in size with greater than 50% respiratory variability, suggesting right atrial pressure of 3 mmHg. FINDINGS  Left Ventricle: Left ventricular ejection fraction, by estimation, is 60 to 65%. The left ventricle has normal function. The left ventricle has no regional wall motion abnormalities. Strain was performed and the global longitudinal strain is indeterminate. The left ventricular internal  cavity size was normal in size. There is moderate concentric left ventricular hypertrophy. Left ventricular diastolic parameters were normal. Right Ventricle: The right ventricular size is normal. No increase in right ventricular wall thickness. Right ventricular systolic function is normal. Left Atrium: Left atrial size was normal in size. Right Atrium: Right atrial size was normal in size. Pericardium: There is no evidence of pericardial effusion. Mitral Valve: The mitral valve is normal in structure. Mild mitral valve regurgitation. No evidence of mitral valve stenosis. Tricuspid Valve: The tricuspid valve is normal in structure. Tricuspid valve regurgitation is mild . No evidence of tricuspid stenosis. Aortic Valve: The aortic valve is normal in structure. Aortic valve regurgitation is mild. No aortic stenosis is present. Aortic valve mean gradient measures 10.0 mmHg. Aortic valve peak gradient measures 18.2 mmHg. Aortic valve area, by VTI measures 1.74 cm. Pulmonic Valve: The pulmonic valve was normal in structure. Pulmonic valve regurgitation is mild. No evidence of pulmonic stenosis. Aorta: The aortic root is normal in size and structure. Venous: The inferior vena cava is normal in size with greater than 50% respiratory variability, suggesting right atrial pressure of 3 mmHg. IAS/Shunts: No atrial level shunt detected by color flow Doppler. Additional Comments: 3D was performed not requiring image post processing on an independent workstation and was normal.  LEFT VENTRICLE PLAX 2D LVIDd:         4.30 cm   Diastology LVIDs:         3.00 cm   LV e' medial:    9.36 cm/s LV PW:         1.70 cm   LV E/e' medial:  9.2 LV IVS:        1.40 cm   LV e' lateral:   8.81 cm/s LVOT diam:     2.10 cm   LV E/e' lateral: 9.8 LV SV:         72 LV SV Index:   32 LVOT Area:     3.46 cm LV IVRT:       71 msec  RIGHT VENTRICLE RV Basal diam:  2.70 cm     PULMONARY VEINS RV S prime:     22.00 cm/s  Diastolic Velocity: 58.60 cm/s  TAPSE (M-mode): 2.4 cm      S/D Velocity:       1.20                             Systolic Velocity:  71.30 cm/s LEFT ATRIUM             Index        RIGHT ATRIUM           Index LA diam:        3.90 cm 1.74 cm/m   RA Area:     19.50 cm LA Vol (A2C):   65.8 ml 29.44 ml/m  RA Volume:   50.30 ml  22.50 ml/m LA Vol (A4C):   60.5 ml 27.06 ml/m LA Biplane Vol: 63.7 ml 28.50 ml/m  AORTIC VALVE                     PULMONIC VALVE AV Area (Vmax):    1.55 cm      PV Vmax:        1.30 m/s AV Area (Vmean):   1.58 cm      PV Peak grad:   6.8 mmHg AV Area (VTI):     1.74 cm      RVOT Peak grad: 3 mmHg AV Vmax:           213.50 cm/s AV Vmean:          148.000 cm/s AV VTI:            0.413 m AV Peak Grad:      18.2 mmHg AV Mean Grad:      10.0 mmHg LVOT Vmax:         95.80 cm/s LVOT Vmean:        67.500 cm/s LVOT VTI:          0.207 m LVOT/AV VTI ratio: 0.50  AORTA Ao Root diam: 3.70 cm Ao Asc diam:  3.30 cm MITRAL VALVE MV Area (PHT): 4.65 cm    SHUNTS MV Decel Time: 163 msec    Systemic VTI:  0.21 m MV E velocity: 86.50 cm/s  Systemic Diam: 2.10 cm MV A velocity: 76.00 cm/s MV E/A ratio:  1.14 Shaukat Khan Electronically signed by Denyse Bathe Signature Date/Time: 07/15/2024/2:55:27 PM    Final    DG Chest 2 View Result Date: 07/15/2024 EXAM: 2 VIEW(S) XRAY OF THE CHEST 07/15/2024 02:46:00 AM COMPARISON: 11/10/2023 CLINICAL HISTORY: SOB FINDINGS: LUNGS AND PLEURA: Lungs demonstrate patchy opacities in the bases bilaterally. Central venous congestion with mild edema is noted. No sizable pleural effusion is seen. No pneumothorax. HEART AND MEDIASTINUM: Borderline cardiomegaly. BONES AND SOFT TISSUES: No acute osseous abnormality. IMPRESSION: 1. Patchy bibasilar opacities 2. Changes of mild chf Electronically signed by: Oneil Devonshire MD 07/15/2024 03:01 AM EST RP Workstation: [id number]    Microbiology: Results for orders placed or performed during the hospital encounter of 07/15/24  Resp panel by RT-PCR (RSV, Flu  A&B, Covid) Anterior Nasal Swab     Status: None   Collection Time: 07/15/24  2:39 AM   Specimen: Anterior Nasal Swab  Result Value Ref Range Status   SARS Coronavirus 2 by RT PCR NEGATIVE NEGATIVE Final    Comment: (NOTE) SARS-CoV-2 target nucleic acids are NOT DETECTED.  The SARS-CoV-2 RNA is  generally detectable in upper respiratory specimens during the acute phase of infection. The lowest concentration of SARS-CoV-2 viral copies this assay can detect is 138 copies/mL. A negative result does not preclude SARS-Cov-2 infection and should not be used as the sole basis for treatment or other patient management decisions. A negative result may occur with  improper specimen collection/handling, submission of specimen other than nasopharyngeal swab, presence of viral mutation(s) within the areas targeted by this assay, and inadequate number of viral copies(<138 copies/mL). A negative result must be combined with clinical observations, patient history, and epidemiological information. The expected result is Negative.  Fact Sheet for Patients:  bloggercourse.com  Fact Sheet for Healthcare Providers:  seriousbroker.it  This test is no t yet approved or cleared by the [place]  FDA and  has been authorized for detection and/or diagnosis of SARS-CoV-2 by FDA under an Emergency Use Authorization (EUA). This EUA will remain  in effect (meaning this test can be used) for the duration of the COVID-19 declaration under Section 564(b)(1) of the Act, 21 U.S.C.section 360bbb-3(b)(1), unless the authorization is terminated  or revoked sooner.       Influenza A by PCR NEGATIVE NEGATIVE Final   Influenza B by PCR NEGATIVE NEGATIVE Final    Comment: (NOTE) The Xpert Xpress SARS-CoV-2/FLU/RSV plus assay is intended as an aid in the diagnosis of influenza from Nasopharyngeal swab specimens and should not be used as a sole basis for treatment.  Nasal washings and aspirates are unacceptable for Xpert Xpress SARS-CoV-2/FLU/RSV testing.  Fact Sheet for Patients: bloggercourse.com  Fact Sheet for Healthcare Providers: seriousbroker.it  This test is not yet approved or cleared by the [place]  FDA and has been authorized for detection and/or diagnosis of SARS-CoV-2 by FDA under an Emergency Use Authorization (EUA). This EUA will remain in effect (meaning this test can be used) for the duration of the COVID-19 declaration under Section 564(b)(1) of the Act, 21 U.S.C. section 360bbb-3(b)(1), unless the authorization is terminated or revoked.     Resp Syncytial Virus by PCR NEGATIVE NEGATIVE Final    Comment: (NOTE) Fact Sheet for Patients: bloggercourse.com  Fact Sheet for Healthcare Providers: seriousbroker.it  This test is not yet approved or cleared by the [place]  FDA and has been authorized for detection and/or diagnosis of SARS-CoV-2 by FDA under an Emergency Use Authorization (EUA). This EUA will remain in effect (meaning this test can be used) for the duration of the COVID-19 declaration under Section 564(b)(1) of the Act, 21 U.S.C. section 360bbb-3(b)(1), unless the authorization is terminated or revoked.  Performed at [facility], [address] [place], [place] [address]     Labs: CBC: Recent Labs  Lab 07/15/24 0244 07/16/24 0458 07/17/24 0518  WBC 6.5 5.9 7.5  HGB 11.2* 11.7* 12.3*  HCT 35.7* 37.3* 38.0*  MCV 82.6 80.9 80.3  PLT 283 329 344   Basic Metabolic Panel: Recent Labs  Lab 07/15/24 0244 07/15/24 0710 07/16/24 0458 07/17/24 0518  NA 138  --  144 142  K 3.3*  --  3.5 3.3*  CL 102  --  106 103  CO2 26  --  28 29  GLUCOSE 233*  --  104* 68*  BUN 14  --  19 28*  CREATININE 1.00  --  1.18 1.28*  CALCIUM 8.7*  --  9.7 9.5  MG  --  1.7 2.2  --    Liver Function  Tests: No results for input(s): AST, ALT, ALKPHOS, BILITOT, PROT, ALBUMIN  in the last 168 hours. CBG: Recent Labs  Lab 07/16/24 1605 07/16/24 2159 07/17/24 0740 07/17/24 0803 07/17/24 1156  GLUCAP 126* 220* 64* 109* 234*    Discharge time spent: greater than 30 minutes.  Signed: Cresencio Fairly, MD [facility] 07/18/2024
